# Patient Record
Sex: Male | Born: 1960 | Race: White | Hispanic: No | Marital: Married | Smoking: Never smoker
Health system: Southern US, Community
[De-identification: ages and names within clinical notes are randomized; demographics above are authoritative.]

## PROBLEM LIST (undated history)

## (undated) DIAGNOSIS — K219 Gastro-esophageal reflux disease without esophagitis: Secondary | ICD-10-CM

## (undated) DIAGNOSIS — I499 Cardiac arrhythmia, unspecified: Secondary | ICD-10-CM

## (undated) DIAGNOSIS — M1990 Unspecified osteoarthritis, unspecified site: Secondary | ICD-10-CM

## (undated) DIAGNOSIS — N200 Calculus of kidney: Secondary | ICD-10-CM

## (undated) HISTORY — PX: SHOULDER SURGERY: SHX246

## (undated) HISTORY — DX: Cardiac arrhythmia, unspecified: I49.9

## (undated) HISTORY — DX: Unspecified osteoarthritis, unspecified site: M19.90

## (undated) HISTORY — DX: Gastro-esophageal reflux disease without esophagitis: K21.9

## (undated) HISTORY — DX: Calculus of kidney: N20.0

## (undated) HISTORY — PX: KNEE SURGERY: SHX244

---

## 2010-02-13 DEATH — deceased

## 2012-07-02 DIAGNOSIS — M25569 Pain in unspecified knee: Secondary | ICD-10-CM

## 2012-07-02 HISTORY — DX: Pain in unspecified knee: M25.569

## 2014-01-13 DEATH — deceased

## 2014-09-09 ENCOUNTER — Ambulatory Visit
Admission: RE | Admit: 2014-09-09 | Discharge: 2014-09-09 | Disposition: A | Discharge status: Home / Self Care | Payer: No Typology Code available for payment source | Source: Ambulatory Visit | Attending: Nurse Practitioner | Admitting: Nurse Practitioner

## 2014-09-09 ENCOUNTER — Other Ambulatory Visit: Payer: Self-pay | Admitting: Nurse Practitioner

## 2014-09-09 DIAGNOSIS — M5441 Lumbago with sciatica, right side: Secondary | ICD-10-CM

## 2014-09-09 DIAGNOSIS — M5442 Lumbago with sciatica, left side: Principal | ICD-10-CM

## 2014-09-09 DIAGNOSIS — Z139 Encounter for screening, unspecified: Secondary | ICD-10-CM

## 2014-09-10 ENCOUNTER — Other Ambulatory Visit: Payer: Self-pay

## 2015-09-12 DIAGNOSIS — G8929 Other chronic pain: Secondary | ICD-10-CM | POA: Insufficient documentation

## 2015-09-12 DIAGNOSIS — M5412 Radiculopathy, cervical region: Secondary | ICD-10-CM | POA: Insufficient documentation

## 2015-09-12 HISTORY — DX: Radiculopathy, cervical region: M54.12

## 2015-09-12 HISTORY — DX: Other chronic pain: G89.29

## 2015-12-01 ENCOUNTER — Other Ambulatory Visit: Payer: Self-pay | Admitting: Orthopedic Surgery

## 2015-12-01 DIAGNOSIS — R609 Edema, unspecified: Secondary | ICD-10-CM

## 2015-12-01 DIAGNOSIS — M25561 Pain in right knee: Secondary | ICD-10-CM

## 2016-04-12 DIAGNOSIS — R079 Chest pain, unspecified: Secondary | ICD-10-CM

## 2016-04-12 DIAGNOSIS — R9439 Abnormal result of other cardiovascular function study: Secondary | ICD-10-CM | POA: Insufficient documentation

## 2016-04-12 DIAGNOSIS — I493 Ventricular premature depolarization: Secondary | ICD-10-CM

## 2016-04-12 HISTORY — DX: Chest pain, unspecified: R07.9

## 2016-04-12 HISTORY — DX: Abnormal result of other cardiovascular function study: R94.39

## 2016-04-12 HISTORY — DX: Ventricular premature depolarization: I49.3

## 2016-06-19 DIAGNOSIS — M5137 Other intervertebral disc degeneration, lumbosacral region: Secondary | ICD-10-CM | POA: Insufficient documentation

## 2016-06-19 DIAGNOSIS — M51379 Other intervertebral disc degeneration, lumbosacral region without mention of lumbar back pain or lower extremity pain: Secondary | ICD-10-CM

## 2016-06-19 HISTORY — DX: Other intervertebral disc degeneration, lumbosacral region without mention of lumbar back pain or lower extremity pain: M51.379

## 2017-01-24 IMAGING — CR DG ORBITS FOR FOREIGN BODY
2 series · 2 of 2 positions shown · non-contrast
Comparison: None.

CLINICAL DATA: Metal working/exposure; clearance prior to MRI

EXAM:
ORBITS FOR FOREIGN BODY - 2 VIEW

[w orbit pa (1 of 2)]
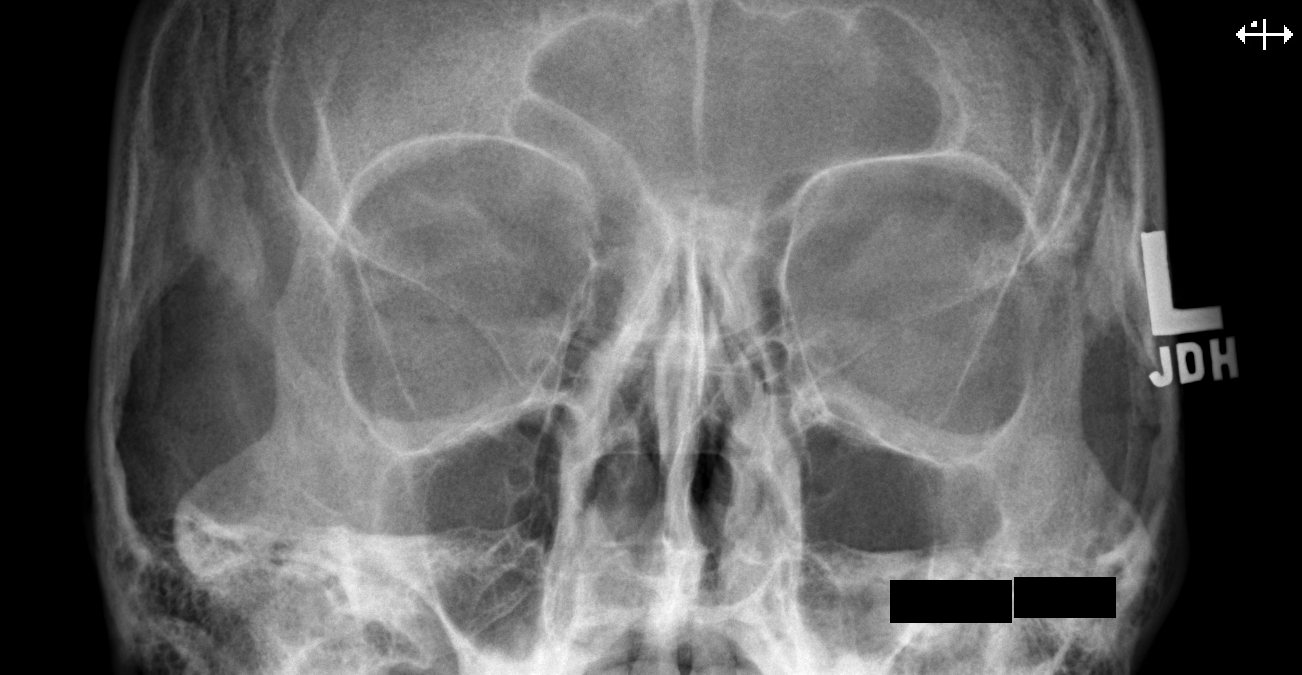

[w orbit pa (2 of 2)]
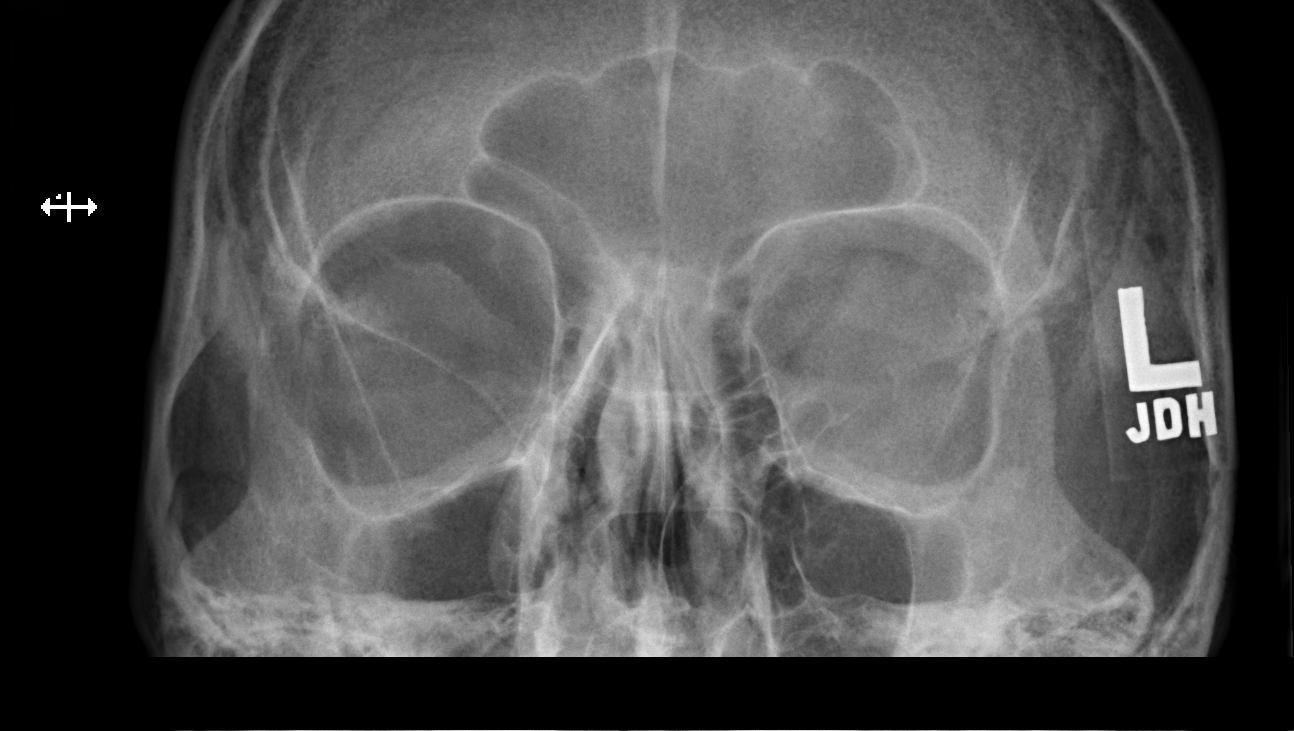

[2 of 2 positions shown; findings below may reference images not displayed]

FINDINGS: There is no evidence of metallic foreign body within the orbits. No
significant bone abnormality identified. The observed portions of
the paranasal sinuses are clear.
IMPRESSION: No evidence of metallic foreign body within the orbits.

## 2017-08-27 DIAGNOSIS — R1032 Left lower quadrant pain: Secondary | ICD-10-CM | POA: Insufficient documentation

## 2017-08-27 DIAGNOSIS — G629 Polyneuropathy, unspecified: Secondary | ICD-10-CM | POA: Insufficient documentation

## 2017-08-27 DIAGNOSIS — Z0000 Encounter for general adult medical examination without abnormal findings: Secondary | ICD-10-CM

## 2017-08-27 DIAGNOSIS — Z125 Encounter for screening for malignant neoplasm of prostate: Secondary | ICD-10-CM

## 2017-08-27 DIAGNOSIS — M25841 Other specified joint disorders, right hand: Secondary | ICD-10-CM

## 2017-08-27 HISTORY — DX: Polyneuropathy, unspecified: G62.9

## 2017-08-27 HISTORY — DX: Other specified joint disorders, right hand: M25.841

## 2017-08-27 HISTORY — DX: Left lower quadrant pain: R10.32

## 2017-08-27 HISTORY — DX: Encounter for general adult medical examination without abnormal findings: Z00.00

## 2017-08-27 HISTORY — DX: Encounter for screening for malignant neoplasm of prostate: Z12.5

## 2020-06-06 ENCOUNTER — Ambulatory Visit (INDEPENDENT_AMBULATORY_CARE_PROVIDER_SITE_OTHER): Payer: Self-pay | Admitting: Physician Assistant

## 2020-06-06 ENCOUNTER — Encounter: Payer: Self-pay | Admitting: Physician Assistant

## 2020-06-06 ENCOUNTER — Other Ambulatory Visit: Payer: Self-pay

## 2020-06-06 VITALS — BP 110/62 | HR 67 | Temp 97.3°F | Ht 67.0 in | Wt 185.6 lb

## 2020-06-06 DIAGNOSIS — G8929 Other chronic pain: Secondary | ICD-10-CM | POA: Insufficient documentation

## 2020-06-06 DIAGNOSIS — R252 Cramp and spasm: Secondary | ICD-10-CM | POA: Insufficient documentation

## 2020-06-06 DIAGNOSIS — M545 Low back pain, unspecified: Secondary | ICD-10-CM

## 2020-06-06 DIAGNOSIS — G629 Polyneuropathy, unspecified: Secondary | ICD-10-CM

## 2020-06-06 HISTORY — DX: Cramp and spasm: R25.2

## 2020-06-06 HISTORY — DX: Other chronic pain: G89.29

## 2020-06-06 MED ORDER — PREGABALIN 75 MG PO CAPS: 75.0000 mg | ORAL_CAPSULE | Freq: Two times a day (BID) | ORAL | 1 refills | Status: DC

## 2020-06-06 MED ORDER — MELOXICAM 7.5 MG PO TABS: 7.5000 mg | ORAL_TABLET | Freq: Every day | ORAL | 2 refills | Status: DC

## 2020-06-06 NOTE — Progress Notes (Signed)
New Patient Office Visit  Subjective:  Patient ID: Garrett Shea, male    DOB: 03/23/1961  Age: 60 y.o. MRN: [id number]  CC:  Chief Complaint  Patient presents with  . Back Pain    HPI Garrett Shea presents for chronic back pain - pt states that he has had problems with thoracic and low back pain for many years - has been told in past he has had some deteriorated discs, etc - states now he has tenderness along his spine - occasionally takes ibuprofen which helps some - denies weakness in extremities - no sciatica  Pt states that for awhile he has had cramps in both lower legs and feet - happens intermittently - at times causing significant pain  Pt states he was diagnosed with neuropathy more than 10 years ago - had workup including nerve conduction studies - was told possibly due to 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 Abnormal heart rhythm   . Arthritis   . GERD (gastroesophageal reflux disease)   . Kidney stones     Past Surgical History:  Procedure Laterality Date  . KNEE SURGERY Bilateral   . SHOULDER SURGERY Left     Family History  Problem Relation Age of Onset  . Cancer Father     Social History   Socioeconomic History  . Marital status: Married    Spouse name: Not on file  . Number of children: Not on file  . Years of education: Not on file  . Highest education level: Not on file  Occupational History  . Not on file  Tobacco Use  . Smoking status: Never Smoker  . Smokeless tobacco: Never Used  Vaping Use  . Vaping Use: Never used  Substance and Sexual Activity  . Alcohol use: Yes  . Drug use: Never  . Sexual activity: Not on file  Other Topics Concern  . Not on file  Social History Narrative  . Not on file   Social Determinants of Health   Financial Resource Strain: Not on file  Food  Insecurity: Not on file  Transportation Needs: Not on file  Physical Activity: Not on file  Stress: Not on file  Social Connections: Not on file  Intimate Partner Violence: Not on file     Current Outpatient Medications:  .  meloxicam (MOBIC) 7.5 MG tablet, Take 1 tablet (7.5 mg total) by mouth daily., Disp: 30 tablet, Rfl: 2 .  pregabalin (LYRICA) 75 MG capsule, Take 1 capsule (75 mg total) by mouth 2 (two) times daily., Disp: 60 capsule, Rfl: 1   No Known Allergies  ROS CONSTITUTIONAL: Negative for chills, fatigue, fever, unintentional weight gain and unintentional weight loss.  CARDIOVASCULAR: Negative for chest pain, dizziness, palpitations and pedal edema.  RESPIRATORY: Negative for recent cough and dyspnea.  GASTROINTESTINAL: Negative for abdominal pain, acid reflux symptoms, constipation, diarrhea, nausea and vomiting.  MSK: see HPI INTEGUMENTARY: Negative for rash.  PSYCHIATRIC: Negative for sleep disturbance and to question depression screen.  Negative for depression, negative for anhedonia.        Objective:    PHYSICAL EXAM:   VS: BP 110/62 (BP Location: Left Arm, Patient Position: Sitting, Cuff Size: Normal)   Pulse 67   Temp (!) 97.3 F (36.3 C) (Temporal)   Ht 5' 7"  (1.702 m)  Wt 185 lb 9.6 oz (84.2 kg)   SpO2 97%   BMI 29.07 kg/m   GEN: Well nourished, well developed, in no acute distress  Neck: no JVD or masses - no thyromegaly Cardiac: RRR; no murmurs, rubs, or gallops,no edema -  Respiratory:  normal respiratory rate and pattern with no distress - normal breath sounds with no rales, rhonchi, wheezes or rubs Neuro:  Alert and Oriented x 3, S - CN II-Xii grossly intact Psych: euthymic mood, appropriate affect and demeanor  BP 110/62 (BP Location: Left Arm, Patient Position: Sitting, Cuff Size: Normal)   Pulse 67   Temp (!) 97.3 F (36.3 C) (Temporal)   Ht 5' 7"  (1.702 m)   Wt 185 lb 9.6 oz (84.2 kg)   SpO2 97%   BMI 29.07 kg/m  Wt Readings  from Last 3 Encounters:  06/06/20 185 lb 9.6 oz (84.2 kg)     Health Maintenance Due  Topic Date Due  . COLONOSCOPY (Pts 45-38yr Insurance coverage will need to be confirmed)  Never done    There are no preventive care reminders to display for this patient.  No results found for: TSH No results found for: WBC, HGB, HCT, MCV, PLT No results found for: NA, K, CHLORIDE, CO2, GLUCOSE, BUN, CREATININE, BILITOT, ALKPHOS, AST, ALT, PROT, ALBUMIN, CALCIUM, ANIONGAP, EGFR, GFR No results found for: CHOL No results found for: HDL No results found for: LDLCALC No results found for: TRIG No results found for: CHOLHDL No results found for: HGBA1C    Assessment & Plan:   Problem List Items Addressed This Visit      Nervous and Auditory   Neuropathy - Primary   Relevant Medications   pregabalin (LYRICA) 75 MG capsule   Other Relevant Orders   CBC with Differential/Platelet   Comprehensive metabolic panel   TSH   Magnesium     Other   Leg cramps   Relevant Orders   CBC with Differential/Platelet   Comprehensive metabolic panel   TSH   Magnesium   Chronic bilateral low back pain without sciatica   Relevant Medications   meloxicam (MOBIC) 7.5 MG tablet   Other Relevant Orders   CBC with Differential/Platelet   Comprehensive metabolic panel   TSH   Magnesium      Meds ordered this encounter  Medications  . meloxicam (MOBIC) 7.5 MG tablet    Sig: Take 1 tablet (7.5 mg total) by mouth daily.    Dispense:  30 tablet    Refill:  2    Order Specific Question:   Supervising Provider    Answer:Rochel Brome[[id number] . pregabalin (LYRICA) 75 MG capsule    Sig: Take 1 capsule (75 mg total) by mouth 2 (two) times daily.    Dispense:  60 capsule    Refill:  1    Order Specific Question:   Supervising Provider    Answer:   CShelton Silvas   Follow-up: Return if symptoms worsen or fail to improve.    SARA R Konner Saiz, PA-C

## 2020-06-10 LAB — CBC WITH DIFFERENTIAL/PLATELET
Basophils Absolute: 0.1 10*3/uL (ref 0.0–0.2)
Basos: 1 %
EOS (ABSOLUTE): 0.2 10*3/uL (ref 0.0–0.4)
Eos: 2 %
Hematocrit: 47 % (ref 37.5–51.0)
Hemoglobin: 16.6 g/dL (ref 13.0–17.7)
Immature Grans (Abs): 0 10*3/uL (ref 0.0–0.1)
Immature Granulocytes: 0 %
Lymphocytes Absolute: 1.8 10*3/uL (ref 0.7–3.1)
Lymphs: 21 %
MCH: 32.3 pg (ref 26.6–33.0)
MCHC: 35.3 g/dL (ref 31.5–35.7)
MCV: 91 fL (ref 79–97)
Monocytes Absolute: 0.6 10*3/uL (ref 0.1–0.9)
Monocytes: 7 %
Neutrophils Absolute: 5.8 10*3/uL (ref 1.4–7.0)
Neutrophils: 69 %
Platelets: 229 10*3/uL (ref 150–450)
RBC: 5.14 x10E6/uL (ref 4.14–5.80)
RDW: 12.7 % (ref 11.6–15.4)
WBC: 8.4 10*3/uL (ref 3.4–10.8)

## 2020-06-10 LAB — COMPREHENSIVE METABOLIC PANEL
ALT: 16 IU/L (ref 0–44)
AST: 20 IU/L (ref 0–40)
Albumin/Globulin Ratio: 2 (ref 1.2–2.2)
Albumin: 4.7 g/dL (ref 3.8–4.9)
Alkaline Phosphatase: 106 IU/L (ref 44–121)
BUN/Creatinine Ratio: 15 (ref 9–20)
BUN: 12 mg/dL (ref 6–24)
Bilirubin Total: 1.1 mg/dL (ref 0.0–1.2)
CO2: 21 mmol/L (ref 20–29)
Calcium: 9.7 mg/dL (ref 8.7–10.2)
Chloride: 105 mmol/L (ref 96–106)
Creatinine, Ser: 0.81 mg/dL (ref 0.76–1.27)
GFR calc Af Amer: 112 mL/min/{1.73_m2} (ref >59)
GFR calc non Af Amer: 97 mL/min/{1.73_m2} (ref >59)
Globulin, Total: 2.3 g/dL (ref 1.5–4.5)
Glucose: 89 mg/dL (ref 65–99)
Potassium: 4.3 mmol/L (ref 3.5–5.2)
Sodium: 143 mmol/L (ref 134–144)
Total Protein: 7 g/dL (ref 6.0–8.5)

## 2020-06-10 LAB — TSH: TSH: 0.835 u[IU]/mL (ref 0.450–4.500)

## 2020-06-10 LAB — MAGNESIUM: Magnesium: 2.3 mg/dL (ref 1.6–2.3)

## 2020-06-13 ENCOUNTER — Telehealth: Payer: Self-pay

## 2020-06-13 ENCOUNTER — Encounter: Payer: Self-pay | Admitting: Physician Assistant

## 2020-06-13 NOTE — Telephone Encounter (Signed)
Patient states that he feels that his cough is getting better, He states that his wife sent the my chart message, she has been worried about him. He states he wants to watch his cough a few more days with him being self pay and it is mostly bad at night and don't think he needs an X-Ray right now and will call back if he think it is not getting better at night.   I asked patient is he coughing up anything drainage with his cough he states he has sometimes cough up some clear and some times a little green drainage, I told him with the green drainage it could be possible signs of infection, but he states he will keep an eye on it and call if it gets worse.

## 2020-06-15 ENCOUNTER — Other Ambulatory Visit: Payer: Self-pay | Admitting: Physician Assistant

## 2020-06-15 MED ORDER — PREGABALIN 150 MG PO CAPS: 150.0000 mg | ORAL_CAPSULE | Freq: Two times a day (BID) | ORAL | 0 refills | Status: DC

## 2020-06-16 ENCOUNTER — Telehealth: Payer: Self-pay

## 2020-06-16 ENCOUNTER — Other Ambulatory Visit: Payer: Self-pay

## 2020-06-16 MED ORDER — PREGABALIN 150 MG PO CAPS: 150.0000 mg | ORAL_CAPSULE | Freq: Two times a day (BID) | ORAL | 0 refills | Status: DC

## 2020-06-16 NOTE — Telephone Encounter (Signed)
Patient called wanted to get his Pregabalin sent to [facility] instead of [facility].   I have cancel the order for [facility].

## 2020-06-29 ENCOUNTER — Telehealth: Payer: Self-pay

## 2020-06-29 ENCOUNTER — Telehealth: Payer: Self-pay | Admitting: Physician Assistant

## 2020-06-29 NOTE — Telephone Encounter (Signed)
Addendum to other note on telephone call I recommend mobic 7.5mg  bid lyrica 150mg  bid He is saying his pain is 10/10 --- he has only been seen here once - I have  no recent studies (xrays,MRIs etc) to show why his pain is this severe Per his history he has had problems for years ... --- he needs to have a further workup from ortho or neuro - who would he like to see We do not treat chronic pain and he would need updated workup and could refer for further management

## 2020-06-29 NOTE — Telephone Encounter (Signed)
My recommendations are Mobic 7.5mg  twice daily Lyrica 150mg  twice daily If his pain is 10/10 we can refer to ortho or pain clinic --- we do not treat chronic pain

## 2020-06-29 NOTE — Telephone Encounter (Signed)
Pt left VM requesting call back.  Returned call to pt. Pt states nerves at night are bad. Cramping and pain at night is worse than during day. Moving around lessens pain so when relaxing it is hard. Lyrica does help during the day but at night he is requesting something to take the edge off the pain. 10/10 pain at night. He is doing better sleeping. Pt takes two 150 mg and one 100 mg of Lyrica in the evening and evening dose doesn't help. Takes one tablet of  meloxicam in the morning, takes two if bad but never more than two. Please advise.   Lorita Officer, CCMA 06/29/20 11:22 AM

## 2020-06-30 ENCOUNTER — Telehealth: Payer: Self-pay

## 2020-06-30 NOTE — Telephone Encounter (Signed)
Spoke with patient and he would like to be referred to ortho or neuro. He didn't state which one, He stated " He was under a house and needed to call me back."

## 2020-06-30 NOTE — Telephone Encounter (Signed)
Attempted to call pt. No answer, left generic VM for call back to clinic.   Lorita Officer, CCMA 06/30/20 9:50 AM

## 2020-07-17 ENCOUNTER — Other Ambulatory Visit: Payer: Self-pay | Admitting: Physician Assistant

## 2020-08-28 ENCOUNTER — Other Ambulatory Visit: Payer: Self-pay

## 2020-08-28 MED ORDER — PREGABALIN 150 MG PO CAPS: 150.0000 mg | ORAL_CAPSULE | Freq: Two times a day (BID) | ORAL | 3 refills | Status: DC

## 2020-09-08 ENCOUNTER — Ambulatory Visit: Payer: Self-pay | Admitting: Physician Assistant

## 2020-09-08 ENCOUNTER — Other Ambulatory Visit: Payer: Self-pay | Admitting: Physician Assistant

## 2020-09-08 ENCOUNTER — Telehealth: Payer: Self-pay

## 2020-09-08 DIAGNOSIS — M545 Low back pain, unspecified: Secondary | ICD-10-CM

## 2020-09-08 MED ORDER — MELOXICAM 7.5 MG PO TABS: ORAL_TABLET | ORAL | 2 refills | Status: DC

## 2020-09-08 NOTE — Telephone Encounter (Signed)
Will send in mobic 7.5mg  bid Is at max dose of lyrica

## 2020-09-08 NOTE — Telephone Encounter (Signed)
Patient made aware of information, verbalized understanding. 

## 2020-09-08 NOTE — Telephone Encounter (Signed)
Patient stated that he hurt his knee and it was inflamed and wanted to know if you could send him in something for it.  I looked at patient left knee, it is red and little swollen. Patient can walk and bend the knee.  Also wanted to go up on lyrica due to his neuropathy is not getting better.

## 2020-09-18 ENCOUNTER — Telehealth: Payer: Self-pay

## 2020-09-18 NOTE — Telephone Encounter (Signed)
Clydie Braun called this morning wanting to get Garrett Shea an appointment for anger/depression.  I asked if her if he was having any suicidal thoughts: thoughts of hurting his self or someone. Clydie Braun stated yes. I spoke with Dr. Sedalia Muta, she stated that he needs to go to [facility] at [address] phone [phone] or go to the nearest ED. Clydie Braun stated that he probably want go, he is self pay, and she doesn't think he is going to kill himself. I spoke with Dr. Sedalia Muta again about the situation, Dr. Sedalia Muta reiterated that he needs to be seen at [facility] or the ED. Clydie Braun was notified.

## 2020-11-08 ENCOUNTER — Ambulatory Visit: Payer: Self-pay | Admitting: Physician Assistant

## 2021-05-14 ENCOUNTER — Other Ambulatory Visit: Payer: Self-pay | Admitting: Legal Medicine

## 2021-05-18 ENCOUNTER — Other Ambulatory Visit: Payer: Self-pay | Admitting: Legal Medicine

## 2021-05-18 DIAGNOSIS — G629 Polyneuropathy, unspecified: Secondary | ICD-10-CM

## 2021-05-18 MED ORDER — PREGABALIN 150 MG PO CAPS: 150.0000 mg | ORAL_CAPSULE | Freq: Two times a day (BID) | ORAL | 3 refills | Status: DC

## 2021-05-22 ENCOUNTER — Ambulatory Visit: Payer: Self-pay | Admitting: Physician Assistant

## 2021-08-16 ENCOUNTER — Ambulatory Visit: Payer: Self-pay | Admitting: Physician Assistant

## 2021-08-16 ENCOUNTER — Encounter: Payer: Self-pay | Admitting: Physician Assistant

## 2021-08-16 VITALS — BP 136/80 | HR 72 | Temp 98.2°F | Ht 68.0 in | Wt 166.0 lb

## 2021-08-16 DIAGNOSIS — F418 Other specified anxiety disorders: Secondary | ICD-10-CM

## 2021-08-16 DIAGNOSIS — G629 Polyneuropathy, unspecified: Secondary | ICD-10-CM

## 2021-08-16 MED ORDER — CITALOPRAM HYDROBROMIDE 10 MG PO TABS: 10.0000 mg | ORAL_TABLET | Freq: Every day | ORAL | 1 refills | Status: DC

## 2021-08-16 NOTE — Progress Notes (Signed)
? ?Subjective:  ?Patient ID: Garrett Shea, male    DOB: 09/02/1960  Age: 61 y.o. MRN: [id number] ? ?Chief Complaint  ?Patient presents with  ? Peripheral Neuropathy  ? ? ?HPI ? Pt in today for follow up of peripheral neuropathy - pt continues to take lyrica 150mg  bid- he states he has had extensive workup in the past and has been on this medication for several years - he has not been in our office for over a year but has been seeing ortho for knee and shoulder pain ? ?Pt also in today with complaints of depression and 'heartbreak' - says that he and his wife had been having marital issues and they had been [facility] however wife left him 2 weeks ago.  He states he has pictures that show the probability of his wife having extra marital affair.  Pt has been depressed and having trouble eating, sleeping, crying spells.  Has never been on medication in the past for these type symptoms - denies suicidal or homicidal thoughts ?Current Outpatient Medications on File Prior to Visit  ?Medication Sig Dispense Refill  ? aspirin 81 MG EC tablet Take by mouth.    ? pregabalin (LYRICA) 150 MG capsule Take 1 capsule (150 mg total) by mouth 2 (two) times daily. 60 capsule 3  ? ?No current facility-administered medications on file prior to visit.  ? ?Past Medical History:  ?Diagnosis Date  ? Abnormal heart rhythm   ? Arthritis   ? GERD (gastroesophageal reflux disease)   ? Kidney stones   ? ?Past Surgical History:  ?Procedure Laterality Date  ? KNEE SURGERY Bilateral   ? SHOULDER SURGERY Left   ?  ?Family History  ?Problem Relation Age of Onset  ? Cancer Father   ? ?Social History  ? ?Socioeconomic History  ? Marital status: Married  ?  Spouse name: Not on file  ? Number of children: Not on file  ? Years of education: Not on file  ? Highest education level: Not on file  ?Occupational History  ? Not on file  ?Tobacco Use  ? Smoking status: Never  ? Smokeless tobacco: Never  ?Vaping Use  ? Vaping Use: Never  used  ?Substance and Sexual Activity  ? Alcohol use: Yes  ? Drug use: Never  ? Sexual activity: Not on file  ?Other Topics Concern  ? Not on file  ?Social History Narrative  ? Not on file  ? ?Social Determinants of Health  ? ?Financial Resource Strain: Not on file  ?Food Insecurity: Not on file  ?Transportation Needs: Not on file  ?Physical Activity: Not on file  ?Stress: Not on file  ?Social Connections: Not on file  ? ? ?Review of Systems ?CONSTITUTIONAL: Negative for chills, fatigue, fever, ?CARDIOVASCULAR: Negative for chest pain, dizziness, palpitations  ?Resp - no dyspnea ?GASTROINTESTINAL: Negative for abdominal pain, acid reflux symptoms, constipation, diarrhea, nausea and vomiting.  ? ?PSYCHIATRIC: see HPI ?   ? ? ?Objective:  ?BP 136/80   Pulse 72   Temp 98.2 ?F (36.8 ?C)   Ht 5\' 8"  (1.727 m)   Wt 166 lb (75.3 kg)   SpO2 96%   BMI 25.24 kg/m?  ? ? ?  08/16/2021  ?  1:34 PM 06/06/2020  ? 11:06 AM  ?BP/Weight  ?Systolic BP 136 110  ?Diastolic BP 80 62  ?Wt. (Lbs) 166 185.6  ?BMI 25.24 kg/m2 29.07 kg/m2  ? ? ?Physical Exam ?PHYSICAL EXAM:  ? ?VS:  BP 136/80   Pulse 72   Temp 98.2 ?F (36.8 ?C)   Ht 5\' 8"  (1.727 m)   Wt 166 lb (75.3 kg)   SpO2 96%   BMI 25.24 kg/m?  ? ?GEN: Well nourished, well developed, in no acute distress  ?Cardiac: RRR; no murmurs, rubs, or gallops,no edema - ?Respiratory:  normal respiratory rate and pattern with no distress - normal breath sounds with no rales, rhonchi, wheezes or rubs ? ?Psych:pt tearful ? ?Diabetic Foot Exam - Simple   ?No data filed ?  ?  ? ?Lab Results  ?Component Value Date  ? WBC 8.4 06/09/2020  ? HGB 16.6 06/09/2020  ? HCT 47.0 06/09/2020  ? PLT 229 06/09/2020  ? GLUCOSE 89 06/09/2020  ? ALT 16 06/09/2020  ? AST 20 06/09/2020  ? NA 143 06/09/2020  ? K 4.3 06/09/2020  ? CL 105 06/09/2020  ? CREATININE 0.81 06/09/2020  ? BUN 12 06/09/2020  ? CO2 21 06/09/2020  ? TSH 0.835 06/09/2020  ? ? ? ? ?Assessment & Plan:  ? ?Problem List Items Addressed This Visit    ? ?  ? Nervous and Auditory  ? Neuropathy - Primary ?Continue current meds  ? ?Other Visit Diagnoses   ? ? Depression with anxiety      ? Relevant Medications  ? citalopram (CELEXA) 10 MG tablet ?Continue therapy  ? ?  ?. ? ?Meds ordered this encounter  ?Medications  ? citalopram (CELEXA) 10 MG tablet  ?  Sig: Take 1 tablet (10 mg total) by mouth daily.  ?  Dispense:  30 tablet  ?  Refill:  1  ?  Order Specific Question:   Supervising Provider  ?  Answer02/27/2022 Blane Ohara  ? ? ?No orders of the defined types were placed in this encounter. ?  ? ?Follow-up: Return in about 4 weeks (around 09/13/2021) for follow up. ? ?An After Visit Summary was printed and given to the patient. ? ?SARA R Moishe Schellenberg, PA-C ?[facility] ?[phone] ?

## 2021-09-12 ENCOUNTER — Ambulatory Visit: Payer: Self-pay | Admitting: Physician Assistant

## 2021-09-12 ENCOUNTER — Encounter: Payer: Self-pay | Admitting: Physician Assistant

## 2021-09-12 VITALS — BP 116/68 | HR 54 | Temp 97.3°F | Ht 68.0 in | Wt 170.0 lb

## 2021-09-12 DIAGNOSIS — F418 Other specified anxiety disorders: Secondary | ICD-10-CM

## 2021-09-12 MED ORDER — TRAZODONE HCL 50 MG PO TABS: 50.0000 mg | ORAL_TABLET | Freq: Every day | ORAL | 1 refills | Status: DC

## 2021-09-12 NOTE — Progress Notes (Signed)
Subjective:  Patient ID: Garrett Shea, male    DOB: 11-22-1960  Age: 61 y.o. MRN: [id number]  Chief Complaint  Patient presents with   Depression    HPI  Pt in for follow up of mild depression with anxiety symptoms - he states he took about 10 pills of citalopram and states he did not feel it was helping and was starting to have crazy dreams and did not like the way he was feeling.  He is still feeling depressed and anxious - had actually taken a friends valium and states that helped but told we do not use that for long term anxiety and that will not address his depressive symptoms He does have an appt with counselor on 6/19 Current Outpatient Medications on File Prior to Visit  Medication Sig Dispense Refill   aspirin 81 MG EC tablet Take by mouth.     pregabalin (LYRICA) 150 MG capsule Take 1 capsule (150 mg total) by mouth 2 (two) times daily. 60 capsule 3   No current facility-administered medications on file prior to visit.   Past Medical History:  Diagnosis Date   Abnormal heart rhythm    Arthritis    GERD (gastroesophageal reflux disease)    Kidney stones    Past Surgical History:  Procedure Laterality Date   KNEE SURGERY Bilateral    SHOULDER SURGERY Left     Family History  Problem Relation Age of Onset   Cancer Father    Social History   Socioeconomic History   Marital status: Married    Spouse name: Not on file   Number of children: Not on file   Years of education: Not on file   Highest education level: Not on file  Occupational History   Not on file  Tobacco Use   Smoking status: Never   Smokeless tobacco: Never  Vaping Use   Vaping Use: Never used  Substance and Sexual Activity   Alcohol use: Yes   Drug use: Never   Sexual activity: Not on file  Other Topics Concern   Not on file  Social History Narrative   Not on file   Social Determinants of Health   Financial Resource Strain: Not on file  Food Insecurity: Not on file  Transportation  Needs: Not on file  Physical Activity: Not on file  Stress: Not on file  Social Connections: Not on file    Review of Systems CONSTITUTIONAL: Negative for chills, fatigue, fever,   CARDIOVASCULAR: Negative for chest pain, dizziness, palpitations and pedal edema.  RESPIRATORY: Negative for recent cough and dyspnea.   PSYCHIATRIC: see HPI   Objective:  PHYSICAL EXAM:   VS: BP 116/68 (BP Location: Left Arm, Patient Position: Sitting)   Pulse (!) 54   Temp (!) 97.3 F (36.3 C) (Temporal)   Ht 5\' 8"  (1.727 m)   Wt 170 lb (77.1 kg)   SpO2 98%   BMI 25.85 kg/m   GEN: Well nourished, well developed, in no acute distress   Cardiac: RRR; no murmurs, rubs, Respiratory:  normal respiratory rate and pattern with no distress - normal breath sounds with no rales, rhonchi, wheezes or rubs Psych: euthymic mood, appropriate affect and demeanor  Lab Results  Component Value Date   WBC 8.4 06/09/2020   HGB 16.6 06/09/2020   HCT 47.0 06/09/2020   PLT 229 06/09/2020   GLUCOSE 89 06/09/2020   ALT 16 06/09/2020   AST 20 06/09/2020   NA 143 06/09/2020   K 4.3  06/09/2020   CL 105 06/09/2020   CREATININE 0.81 06/09/2020   BUN 12 06/09/2020   CO2 21 06/09/2020   TSH 0.835 06/09/2020      Assessment & Plan:   Problem List Items Addressed This Visit   None Visit Diagnoses     Depression with anxiety    -  Primary   Relevant Medications   traZODone (DESYREL) 50 MG tablet     .  Meds ordered this encounter  Medications   traZODone (DESYREL) 50 MG tablet    Sig: Take 1 tablet (50 mg total) by mouth at bedtime.    Dispense:  30 tablet    Refill:  1    Order Specific Question:   Supervising Provider    Answer:   Corey Harold    No orders of the defined types were placed in this encounter.    Follow-up: Return in about 2 months (around 11/12/2021) for follow up.  An After Visit Summary was printed and given to the patient.  Jettie Pagan [facility] [phone]

## 2021-11-12 ENCOUNTER — Ambulatory Visit: Payer: Self-pay | Admitting: Physician Assistant

## 2021-11-28 DIAGNOSIS — F4325 Adjustment disorder with mixed disturbance of emotions and conduct: Secondary | ICD-10-CM | POA: Diagnosis not present

## 2021-12-18 DIAGNOSIS — F4325 Adjustment disorder with mixed disturbance of emotions and conduct: Secondary | ICD-10-CM | POA: Diagnosis not present

## 2022-01-16 DIAGNOSIS — F4325 Adjustment disorder with mixed disturbance of emotions and conduct: Secondary | ICD-10-CM | POA: Diagnosis not present

## 2022-01-31 DIAGNOSIS — F4325 Adjustment disorder with mixed disturbance of emotions and conduct: Secondary | ICD-10-CM | POA: Diagnosis not present

## 2022-02-14 ENCOUNTER — Other Ambulatory Visit: Payer: Self-pay | Admitting: Legal Medicine

## 2022-02-14 DIAGNOSIS — G629 Polyneuropathy, unspecified: Secondary | ICD-10-CM

## 2022-02-15 ENCOUNTER — Other Ambulatory Visit: Payer: Self-pay

## 2022-02-15 DIAGNOSIS — G629 Polyneuropathy, unspecified: Secondary | ICD-10-CM

## 2022-02-15 MED ORDER — PREGABALIN 150 MG PO CAPS: 150.0000 mg | ORAL_CAPSULE | Freq: Two times a day (BID) | ORAL | 3 refills | Status: DC

## 2022-02-15 NOTE — Telephone Encounter (Signed)
Appointment made

## 2022-02-21 ENCOUNTER — Telehealth: Payer: Self-pay

## 2022-02-21 DIAGNOSIS — R051 Acute cough: Secondary | ICD-10-CM | POA: Diagnosis not present

## 2022-02-21 DIAGNOSIS — J040 Acute laryngitis: Secondary | ICD-10-CM | POA: Diagnosis not present

## 2022-02-21 NOTE — Telephone Encounter (Signed)
Patient called and stated he has an heart rate of 44 and has some sinus symptoms.  Per Marianne Sofia: Because of the low heart rate and his symptoms patient needs to go to nearest hospital  Patient verbalized Understanding.

## 2022-03-04 DIAGNOSIS — R197 Diarrhea, unspecified: Secondary | ICD-10-CM | POA: Diagnosis not present

## 2022-03-04 DIAGNOSIS — R112 Nausea with vomiting, unspecified: Secondary | ICD-10-CM | POA: Diagnosis not present

## 2022-03-27 ENCOUNTER — Telehealth: Payer: Self-pay

## 2022-03-27 NOTE — Telephone Encounter (Signed)
Patient called and stated he wanted to cancel appointment due to running a fever and he will call and reschedule appointment when he feels better.

## 2022-03-29 ENCOUNTER — Ambulatory Visit: Payer: Self-pay | Admitting: Physician Assistant

## 2022-05-13 DIAGNOSIS — R9431 Abnormal electrocardiogram [ECG] [EKG]: Secondary | ICD-10-CM | POA: Diagnosis not present

## 2022-05-13 DIAGNOSIS — F1290 Cannabis use, unspecified, uncomplicated: Secondary | ICD-10-CM | POA: Diagnosis not present

## 2022-05-13 DIAGNOSIS — F1210 Cannabis abuse, uncomplicated: Secondary | ICD-10-CM | POA: Diagnosis not present

## 2022-05-13 DIAGNOSIS — Z1331 Encounter for screening for depression: Secondary | ICD-10-CM | POA: Diagnosis not present

## 2022-05-13 DIAGNOSIS — Z046 Encounter for general psychiatric examination, requested by authority: Secondary | ICD-10-CM | POA: Diagnosis not present

## 2022-05-14 DIAGNOSIS — F1290 Cannabis use, unspecified, uncomplicated: Secondary | ICD-10-CM | POA: Diagnosis not present

## 2022-05-14 DIAGNOSIS — Z1331 Encounter for screening for depression: Secondary | ICD-10-CM | POA: Diagnosis not present

## 2022-05-15 ENCOUNTER — Telehealth: Payer: Self-pay

## 2022-05-15 NOTE — Telephone Encounter (Signed)
Transition Care Management Unsuccessful Follow-up Telephone Call  Date of discharge and from where:  [facility] 05/14/2022  Attempts:  1st Attempt  Reason for unsuccessful TCM follow-up call:  Left voice message Juanda Crumble, [place] Direct Dial [phone]

## 2022-05-15 NOTE — Telephone Encounter (Signed)
Transition Care Management Follow-up Telephone Call Date of discharge and from where: [facility] 05/14/2022 How have you been since you were released from the hospital? Not good Any questions or concerns? No  Items Reviewed: Did the pt receive and understand the discharge instructions provided? Yes  Medications obtained and verified? Yes  Other? No  Any new allergies since your discharge? No  Dietary orders reviewed? Yes Do you have support at home? No   Home Care and Equipment/Supplies: Were home health services ordered? no If so, what is the name of the agency? N/a  Has the agency set up a time to come to the patient's home? no Were any new equipment or medical supplies ordered?  No What is the name of the medical supply agency? N/a Were you able to get the supplies/equipment? no Do you have any questions related to the use of the equipment or supplies? No  Functional Questionnaire: (I = Independent and D = Dependent) ADLs: I  Bathing/Dressing- I  Meal Prep- I  Eating- I  Maintaining continence- I  Transferring/Ambulation- I  Managing Meds- I  Follow up appointments reviewed:  PCP Hospital f/u appt confirmed? No  no avail appts until 05/27/2022 sent message to staff to schedule Specialist Hospital f/u appt confirmed? No   Are transportation arrangements needed? No  If their condition worsens, is the pt aware to call PCP or go to the Emergency Dept.? Yes Was the patient provided with contact information for the PCP's office or ED? Yes Was to pt encouraged to call back with questions or concerns? Yes Juanda Crumble, LPN [place] Direct Dial [phone]

## 2022-05-23 DIAGNOSIS — F4323 Adjustment disorder with mixed anxiety and depressed mood: Secondary | ICD-10-CM | POA: Diagnosis not present

## 2022-05-29 DIAGNOSIS — F331 Major depressive disorder, recurrent, moderate: Secondary | ICD-10-CM | POA: Diagnosis not present

## 2022-05-30 DIAGNOSIS — F331 Major depressive disorder, recurrent, moderate: Secondary | ICD-10-CM | POA: Diagnosis not present

## 2022-07-08 DIAGNOSIS — F331 Major depressive disorder, recurrent, moderate: Secondary | ICD-10-CM | POA: Diagnosis not present

## 2022-07-11 DIAGNOSIS — F419 Anxiety disorder, unspecified: Secondary | ICD-10-CM | POA: Diagnosis not present

## 2022-07-18 DIAGNOSIS — F419 Anxiety disorder, unspecified: Secondary | ICD-10-CM | POA: Diagnosis not present

## 2022-08-02 DIAGNOSIS — H9201 Otalgia, right ear: Secondary | ICD-10-CM | POA: Diagnosis not present

## 2022-08-02 DIAGNOSIS — J309 Allergic rhinitis, unspecified: Secondary | ICD-10-CM | POA: Diagnosis not present

## 2022-08-02 DIAGNOSIS — H6691 Otitis media, unspecified, right ear: Secondary | ICD-10-CM | POA: Diagnosis not present

## 2022-08-08 DIAGNOSIS — F419 Anxiety disorder, unspecified: Secondary | ICD-10-CM | POA: Diagnosis not present

## 2022-08-22 ENCOUNTER — Ambulatory Visit: Payer: Self-pay | Admitting: Physician Assistant

## 2022-08-22 DIAGNOSIS — R0981 Nasal congestion: Secondary | ICD-10-CM | POA: Diagnosis not present

## 2022-08-22 DIAGNOSIS — H9201 Otalgia, right ear: Secondary | ICD-10-CM | POA: Diagnosis not present

## 2022-08-22 DIAGNOSIS — J0100 Acute maxillary sinusitis, unspecified: Secondary | ICD-10-CM | POA: Diagnosis not present

## 2022-08-27 ENCOUNTER — Emergency Department (HOSPITAL_COMMUNITY)
Admission: EM | Admit: 2022-08-27 | Discharge: 2022-08-28 | Disposition: A | Discharge status: Home / Self Care | Payer: Worker's Compensation | Attending: Student | Admitting: Student

## 2022-08-27 ENCOUNTER — Encounter (HOSPITAL_COMMUNITY): Payer: Self-pay

## 2022-08-27 ENCOUNTER — Other Ambulatory Visit: Payer: Self-pay

## 2022-08-27 DIAGNOSIS — S46211A Strain of muscle, fascia and tendon of other parts of biceps, right arm, initial encounter: Secondary | ICD-10-CM

## 2022-08-27 DIAGNOSIS — S4991XA Unspecified injury of right shoulder and upper arm, initial encounter: Secondary | ICD-10-CM | POA: Diagnosis not present

## 2022-08-27 DIAGNOSIS — X500XXA Overexertion from strenuous movement or load, initial encounter: Secondary | ICD-10-CM | POA: Diagnosis not present

## 2022-08-27 DIAGNOSIS — Z7982 Long term (current) use of aspirin: Secondary | ICD-10-CM | POA: Diagnosis not present

## 2022-08-27 LAB — CBC WITH DIFFERENTIAL/PLATELET
Abs Immature Granulocytes: 0.03 10*3/uL (ref 0.00–0.07)
Basophils Absolute: 0.1 10*3/uL (ref 0.0–0.1)
Basophils Relative: 1 %
Eosinophils Absolute: 0.1 10*3/uL (ref 0.0–0.5)
Eosinophils Relative: 2 %
HCT: 45.8 % (ref 39.0–52.0)
Hemoglobin: 15.7 g/dL (ref 13.0–17.0)
Immature Granulocytes: 0 %
Lymphocytes Relative: 21 %
Lymphs Abs: 1.5 10*3/uL (ref 0.7–4.0)
MCH: 32 pg (ref 26.0–34.0)
MCHC: 34.3 g/dL (ref 30.0–36.0)
MCV: 93.3 fL (ref 80.0–100.0)
Monocytes Absolute: 0.4 10*3/uL (ref 0.1–1.0)
Monocytes Relative: 6 %
Neutro Abs: 5 10*3/uL (ref 1.7–7.7)
Neutrophils Relative %: 70 %
Platelets: 240 10*3/uL (ref 150–400)
RBC: 4.91 MIL/uL (ref 4.22–5.81)
RDW: 12.3 % (ref 11.5–15.5)
WBC: 7.2 10*3/uL (ref 4.0–10.5)
nRBC: 0 % (ref 0.0–0.2)

## 2022-08-27 LAB — BASIC METABOLIC PANEL
Anion gap: 10 (ref 5–15)
BUN: 11 mg/dL (ref 8–23)
CO2: 24 mmol/L (ref 22–32)
Calcium: 9.4 mg/dL (ref 8.9–10.3)
Chloride: 105 mmol/L (ref 98–111)
Creatinine, Ser: 0.99 mg/dL (ref 0.61–1.24)
GFR, Estimated: >60 mL/min (ref >60)
Glucose, Bld: 164 mg/dL — ABNORMAL HIGH (ref 70–99)
Potassium: 4 mmol/L (ref 3.5–5.1)
Sodium: 139 mmol/L (ref 135–145)

## 2022-08-27 NOTE — ED Triage Notes (Signed)
Pt arrives with c/o right arm injury that happened today. Pt seen at [facility] and told her had a tendon rupture and needed to be evaluated at the ED.

## 2022-08-28 ENCOUNTER — Emergency Department (HOSPITAL_COMMUNITY): Payer: Self-pay

## 2022-08-28 MED ORDER — KETOROLAC TROMETHAMINE 15 MG/ML IJ SOLN
15.0000 mg | Freq: Once | INTRAMUSCULAR | Status: AC
  Administered 2022-08-28: 15 mg via INTRAMUSCULAR
  Filled 2022-08-28: qty 1

## 2022-08-28 MED ORDER — NAPROXEN 375 MG PO TABS: 375.0000 mg | ORAL_TABLET | Freq: Two times a day (BID) | ORAL | 0 refills | Status: DC

## 2022-08-28 MED ORDER — OXYCODONE HCL 5 MG PO TABS: 5.0000 mg | ORAL_TABLET | ORAL | 0 refills | Status: AC | PRN

## 2022-08-28 MED ORDER — ACETAMINOPHEN 500 MG PO TABS: 1000.0000 mg | ORAL_TABLET | Freq: Three times a day (TID) | ORAL | 0 refills | Status: AC

## 2022-08-28 NOTE — ED Provider Notes (Signed)
[place] EMERGENCY DEPARTMENT AT [facility] Provider Note  CSN: [id number] Arrival date & time: 08/27/22 1930  Chief Complaint(s) Arm Injury  HPI Garrett Shea is a 62 y.o. male who presents emergency department for evaluation of right upper extremity injury.  Patient was reportedly moving heavy tractor around 8 AM on 08/27/2022 when he had a sudden onset sharp pain in the arm.  He went to urgent care who had concern for tendon rupture and sent him to the emergency department.  Here in the emergency room, patient arrives with a palpable tender mass over the right bicep but denies numbness, tingling, weakness of the upper extremity.  Pulses intact on arrival.  No additional external signs of trauma seen.   Past Medical History Past Medical History:  Diagnosis Date   Abnormal heart rhythm    Arthritis    GERD (gastroesophageal reflux disease)    Kidney stones    Patient Active Problem List   Diagnosis Date Noted   Leg cramps 06/06/2020   Chronic bilateral low back pain without sciatica 06/06/2020   Cyst of joint of hand, right 08/27/2017   Left lower quadrant pain 08/27/2017   Neuropathy 08/27/2017   Screening PSA (prostate specific antigen) 08/27/2017   Well adult on routine health check 08/27/2017   Degeneration of intervertebral disc at L5-S1 level 06/19/2016   Abnormal stress echocardiogram 04/12/2016   Chest pain 04/12/2016   Premature ventricular contractions 04/12/2016   Cervical radiculopathy 09/12/2015   Chronic right shoulder pain 09/12/2015   Knee pain 07/02/2012   Home Medication(s) Prior to Admission medications   Medication Sig Start Date End Date Taking? Authorizing Provider  aspirin 81 MG EC tablet Take by mouth.    [provider]  pregabalin (LYRICA) 150 MG capsule Take 1 capsule (150 mg total) by mouth 2 (two) times daily. 02/15/22   CoxFritzi Mandes, MD  traZODone (DESYREL) 50 MG tablet Take 1 tablet (50 mg total) by mouth at bedtime. 09/12/21    Marianne Sofia, PA-C                                                                                                                                    Past Surgical History Past Surgical History:  Procedure Laterality Date   KNEE SURGERY Bilateral    SHOULDER SURGERY Left    Family History Family History  Problem Relation Age of Onset   Cancer Father     Social History Social History   Tobacco Use   Smoking status: Never   Smokeless tobacco: Never  Vaping Use   Vaping Use: Never used  Substance Use Topics   Alcohol use: Yes   Drug use: Never   Allergies Patient has no known allergies.  Review of Systems Review of Systems  Musculoskeletal:  Positive for arthralgias, joint swelling and myalgias.    Physical Exam Vital Signs  I have reviewed the triage vital signs  BP 126/66 (BP Location: Left Arm)   Pulse (!) 50   Temp 97.9 F (36.6 C) (Oral)   Resp 16   Wt 77.1 kg   SpO2 98%   BMI 25.85 kg/m   Physical Exam Constitutional:      General: He is not in acute distress.    Appearance: Normal appearance.  HENT:     Head: Normocephalic and atraumatic.     Nose: No congestion or rhinorrhea.  Eyes:     General:        Right eye: No discharge.        Left eye: No discharge.     Extraocular Movements: Extraocular movements intact.     Pupils: Pupils are equal, round, and reactive to light.  Cardiovascular:     Rate and Rhythm: Normal rate and regular rhythm.     Heart sounds: No murmur heard. Pulmonary:     Effort: No respiratory distress.     Breath sounds: No wheezing or rales.  Abdominal:     General: There is no distension.     Tenderness: There is no abdominal tenderness.  Musculoskeletal:        General: Swelling, tenderness and deformity present. Normal range of motion.     Cervical back: Normal range of motion.  Skin:    General: Skin is warm and dry.  Neurological:     General: No focal deficit present.     Mental Status: He is alert.     ED  Results and Treatments Labs (all labs ordered are listed, but only abnormal results are displayed) Labs Reviewed  BASIC METABOLIC PANEL - Abnormal; Notable for the following components:      Result Value   Glucose, Bld 164 (*)    All other components within normal limits  CBC WITH DIFFERENTIAL/PLATELET                                                                                                                          Radiology No results found.  Pertinent labs & imaging results that were available during my care of the patient were reviewed by me and considered in my medical decision making (see MDM for details).  Medications Ordered in ED Medications - No data to display  Procedures Procedures  (including critical care time)  Medical Decision Making / ED Course   This patient presents to the ED for concern of right arm injury, this involves an extensive number of treatment options, and is a complaint that carries with it a high risk of complications and morbidity.  The differential diagnosis includes biceps tendon rupture, fracture, ligamentous injury, hematoma, contusion  MDM: Patient seen emergency room for evaluation of a right arm injury.  Physical exam with a painful tender mass at the insertion point of the bicep at the antecubital fossa concerning for a biceps tear.  X-ray without fracture.  I spoke with Dr. Ave Filter of orthopedics who is recommending the patient follow-up with him in clinic this morning at 8 AM.  Patient will call the office for urgent ER follow-up.  Patient placed in a sling and pain controlled with Toradol.  Patient then discharged with outpatient orthopedic follow-up.  Additional history obtained: -External records from outside source obtained and reviewed including: Chart review including previous notes, labs, imaging,  consultation notes   Lab Tests: -I ordered, reviewed, and interpreted labs.   The pertinent results include:   Labs Reviewed  BASIC METABOLIC PANEL - Abnormal; Notable for the following components:      Result Value   Glucose, Bld 164 (*)    All other components within normal limits  CBC WITH DIFFERENTIAL/PLATELET      Imaging Studies ordered: I ordered imaging studies including XR humerus I independently visualized and interpreted imaging. I agree with the radiologist interpretation   Medicines ordered and prescription drug management: No orders of the defined types were placed in this encounter.   -I have reviewed the patients home medicines and have made adjustments as needed  Critical interventions none  Consultations Obtained: I requested consultation with the orthopedic surgeon on-call Dr. Ave Filter,  and discussed lab and imaging findings as well as pertinent plan - they recommend: Outpatient follow-up this morning   Cardiac Monitoring: The patient was maintained on a cardiac monitor.  I personally viewed and interpreted the cardiac monitored which showed an underlying rhythm of: NSR  Social Determinants of Health:  Factors impacting patients care include: none   Reevaluation: After the interventions noted above, I reevaluated the patient and found that they have :improved  Co morbidities that complicate the patient evaluation  Past Medical History:  Diagnosis Date   Abnormal heart rhythm    Arthritis    GERD (gastroesophageal reflux disease)    Kidney stones       Dispostion: I considered admission for this patient, but at this time he does not meet inpatient criteria for admission he is safe for discharge with urgent orthopedic follow-up     Final Clinical Impression(s) / ED Diagnoses Final diagnoses:  None     @PCDICTATION @    Glendora Score, MD 08/28/22 0124

## 2022-08-28 NOTE — Discharge Instructions (Addendum)
For pain:  - Acetaminophen 1000 mg three times daily (every 8 hours) - Naproxen 2 times daily (every 12 hours) - oxycodone for breakthrough pain only 

## 2022-09-04 DIAGNOSIS — F419 Anxiety disorder, unspecified: Secondary | ICD-10-CM | POA: Diagnosis not present

## 2022-09-14 DIAGNOSIS — S0001XA Abrasion of scalp, initial encounter: Secondary | ICD-10-CM | POA: Diagnosis not present

## 2022-09-23 ENCOUNTER — Encounter: Payer: Self-pay | Admitting: Physician Assistant

## 2022-09-23 ENCOUNTER — Ambulatory Visit (INDEPENDENT_AMBULATORY_CARE_PROVIDER_SITE_OTHER): Payer: BC Managed Care – PPO | Admitting: Physician Assistant

## 2022-09-23 VITALS — BP 110/66 | HR 68 | Temp 97.0°F | Ht 68.0 in | Wt 163.2 lb

## 2022-09-23 DIAGNOSIS — D492 Neoplasm of unspecified behavior of bone, soft tissue, and skin: Secondary | ICD-10-CM

## 2022-09-23 NOTE — Progress Notes (Signed)
   Acute Office Visit  Subjective:    Patient ID: Garrett Shea, male    DOB: Jan 21, 1961, 62 y.o.   MRN: [id number]  Chief Complaint  Patient presents with   Spot on left side of head    HPI: Patient is in today for complaints of scalp lesion.  He states he noted it about a month or so ago and is getting bigger.  States at times it is irritative.  He went to urgent care last week and was prescribed bactrim for it.  No improvement of symptoms    Current Outpatient Medications:    acetaminophen (TYLENOL) 500 MG tablet, Take 2 tablets (1,000 mg total) by mouth every 8 (eight) hours., Disp: 180 tablet, Rfl: 0   naproxen (NAPROSYN) 375 MG tablet, Take 1 tablet (375 mg total) by mouth 2 (two) times daily., Disp: 20 tablet, Rfl: 0   oxyCODONE (ROXICODONE) 5 MG immediate release tablet, Take 1 tablet (5 mg total) by mouth every 4 (four) hours as needed for severe pain., Disp: 10 tablet, Rfl: 0   pregabalin (LYRICA) 150 MG capsule, Take 1 capsule (150 mg total) by mouth 2 (two) times daily., Disp: 60 capsule, Rfl: 3   sulfamethoxazole-trimethoprim (BACTRIM DS) 800-160 MG tablet, Take 1 tablet by mouth 2 (two) times daily., Disp: , Rfl:    ALPRAZolam (XANAX) 0.5 MG tablet, Take 0.5 mg by mouth 2 (two) times daily as needed. (Patient not taking: Reported on 09/23/2022), Disp: , Rfl:   No Known Allergies  ROS CONSTITUTIONAL: Negative for chills, fatigue, fever, E/N/T: Negative for ear pain, nasal congestion and sore throat.  CARDIOVASCULAR: Negative for chest pain, RESPIRATORY: pt states has had slight cough this week GASTROINTESTINAL: Negative for abdominal pain,  INTEGUMENTARY: see HPI      Objective:  PHYSICAL EXAM:   VS: BP 110/66 (BP Location: Left Arm, Patient Position: Sitting, Cuff Size: Normal)   Pulse 68   Temp (!) 97 F (36.1 C) (Temporal)   Ht 5\' 8"  (1.727 m)   Wt 163 lb 3.2 oz (74 kg)   SpO2 98%   BMI 24.81 kg/m   GEN: Well nourished, well developed, in no acute distress   HEENT: normal external ears and nose - normal external auditory canals and TMS - - Lips, Teeth and Gums - normal  Oropharynx - normal mucosa, palate, and posterior pharynx Cardiac: RRR; no murmurs,  Respiratory:  normal respiratory rate and pattern with no distress - normal breath sounds with no rales, rhonchi, wheezes or rubs Skin:lesion on side of scalp about size of nickel with thickened tissue - no area of induration or infection - squamoproliferative   Assessment & Plan:    Atypical squamoproliferative skin lesion -     Ambulatory referral to Dermatology     Follow-up: Return if symptoms worsen or fail to improve.  An After Visit Summary was printed and given to the patient.  Jettie Pagan [facility] [phone]

## 2022-09-24 DIAGNOSIS — L821 Other seborrheic keratosis: Secondary | ICD-10-CM | POA: Diagnosis not present

## 2023-05-26 DIAGNOSIS — G629 Polyneuropathy, unspecified: Secondary | ICD-10-CM | POA: Diagnosis not present

## 2023-05-26 DIAGNOSIS — R5383 Other fatigue: Secondary | ICD-10-CM | POA: Diagnosis not present

## 2023-05-26 DIAGNOSIS — R030 Elevated blood-pressure reading, without diagnosis of hypertension: Secondary | ICD-10-CM | POA: Diagnosis not present

## 2023-05-26 DIAGNOSIS — M159 Polyosteoarthritis, unspecified: Secondary | ICD-10-CM | POA: Diagnosis not present

## 2023-06-09 DIAGNOSIS — R600 Localized edema: Secondary | ICD-10-CM | POA: Diagnosis not present

## 2023-06-09 DIAGNOSIS — G629 Polyneuropathy, unspecified: Secondary | ICD-10-CM | POA: Diagnosis not present

## 2023-06-09 DIAGNOSIS — R030 Elevated blood-pressure reading, without diagnosis of hypertension: Secondary | ICD-10-CM | POA: Diagnosis not present

## 2023-06-09 DIAGNOSIS — M159 Polyosteoarthritis, unspecified: Secondary | ICD-10-CM | POA: Diagnosis not present

## 2023-06-23 DIAGNOSIS — M159 Polyosteoarthritis, unspecified: Secondary | ICD-10-CM | POA: Diagnosis not present

## 2023-06-23 DIAGNOSIS — R030 Elevated blood-pressure reading, without diagnosis of hypertension: Secondary | ICD-10-CM | POA: Diagnosis not present

## 2023-06-23 DIAGNOSIS — R600 Localized edema: Secondary | ICD-10-CM | POA: Diagnosis not present

## 2023-06-23 DIAGNOSIS — G629 Polyneuropathy, unspecified: Secondary | ICD-10-CM | POA: Diagnosis not present

## 2023-07-02 DIAGNOSIS — M79641 Pain in right hand: Secondary | ICD-10-CM | POA: Diagnosis not present

## 2023-07-07 DIAGNOSIS — G629 Polyneuropathy, unspecified: Secondary | ICD-10-CM | POA: Diagnosis not present

## 2023-07-07 DIAGNOSIS — R030 Elevated blood-pressure reading, without diagnosis of hypertension: Secondary | ICD-10-CM | POA: Diagnosis not present

## 2023-07-07 DIAGNOSIS — M159 Polyosteoarthritis, unspecified: Secondary | ICD-10-CM | POA: Diagnosis not present

## 2023-07-07 DIAGNOSIS — R600 Localized edema: Secondary | ICD-10-CM | POA: Diagnosis not present

## 2023-09-24 ENCOUNTER — Other Ambulatory Visit: Payer: Self-pay

## 2023-09-24 DIAGNOSIS — I499 Cardiac arrhythmia, unspecified: Secondary | ICD-10-CM | POA: Insufficient documentation

## 2023-09-24 DIAGNOSIS — K219 Gastro-esophageal reflux disease without esophagitis: Secondary | ICD-10-CM | POA: Insufficient documentation

## 2023-09-24 DIAGNOSIS — M1990 Unspecified osteoarthritis, unspecified site: Secondary | ICD-10-CM | POA: Insufficient documentation

## 2023-09-24 DIAGNOSIS — N200 Calculus of kidney: Secondary | ICD-10-CM | POA: Insufficient documentation

## 2023-09-25 ENCOUNTER — Ambulatory Visit

## 2023-09-25 VITALS — BP 140/80 | HR 82 | Ht 68.0 in | Wt 184.4 lb

## 2023-09-25 DIAGNOSIS — R030 Elevated blood-pressure reading, without diagnosis of hypertension: Secondary | ICD-10-CM | POA: Insufficient documentation

## 2023-09-25 DIAGNOSIS — E785 Hyperlipidemia, unspecified: Secondary | ICD-10-CM

## 2023-09-25 DIAGNOSIS — R600 Localized edema: Secondary | ICD-10-CM

## 2023-09-25 DIAGNOSIS — I10 Essential (primary) hypertension: Secondary | ICD-10-CM | POA: Insufficient documentation

## 2023-09-25 DIAGNOSIS — I2510 Atherosclerotic heart disease of native coronary artery without angina pectoris: Secondary | ICD-10-CM

## 2023-09-25 HISTORY — DX: Hyperlipidemia, unspecified: E78.5

## 2023-09-25 HISTORY — DX: Elevated blood-pressure reading, without diagnosis of hypertension: R03.0

## 2023-09-25 HISTORY — DX: Localized edema: R60.0

## 2023-09-25 HISTORY — DX: Atherosclerotic heart disease of native coronary artery without angina pectoris: I25.10

## 2023-09-25 MED ORDER — ASPIRIN 81 MG PO TBEC: 81.0000 mg | DELAYED_RELEASE_TABLET | Freq: Every day | ORAL | 3 refills | Status: AC

## 2023-09-25 NOTE — Progress Notes (Addendum)
 Cardiology Consultation:    Date:  09/25/2023   ID:  Garrett Shea, DOB May 12, 1960, MRN [id number]  PCP:  Garrett Chow, MD  Cardiologist:  Garrett SAUNDERS Elianna Windom, MD   Referring MD: No ref. provider found   No chief complaint on file.    ASSESSMENT AND PLAN:   Garrett Shea 63 year old male with history of mild nonobstructive disease on cardiac cath 04/18/2016 [done in the setting of atypical chest pain and frequent PVCs and abnormal stress echocardiogram results], Holter monitor September 2022 at [facility] with rare ventricular and supraventricular ectopy burden and short runs of SVT up to 4 beats. Also obesity, was able to successfully lose 92 pounds with exercise over the past few years but over the past 1 year since right shoulder surgery and lack of exercise he has gained some weight.  Now here for further evaluation of bilateral lower extremity edema noticing intermittently being managed with Lasix as needed and for evaluation of atypical chest pain.  Also notes suboptimal blood pressure readings at home using his home device   Problem List Items Addressed This Visit     Elevated blood pressure reading without diagnosis of hypertension   Advised to continue maintaining log of blood pressure readings at home for the next couple weeks and if consistently above 130/80 mmHg would recommend antihypertensive medication such as losartan.       Relevant Orders   Pro b natriuretic peptide (BNP)   ECHOCARDIOGRAM COMPLETE   CAD (coronary artery disease)   Mild nonobstructive coronary artery disease on cardiac cath 04-18-2017 done at [facility] in the setting of atypical chest pain and frequent PVCs with abnormal stress echocardiogram at the time.  Has not had follow-ups with cardiology since then. Currently not even taking aspirin  at home.  Now with symptoms of bilateral lower extremity edema and suboptimal pressures, also noticing atypical symptoms of chest pain.  Advised him to start  taking aspirin  81 mg once daily. He is hesitant to start taking statins.  See discussion under hyperlipidemia.  Will review for any significant ischemia with treadmill stress test with nuclear imaging.       Relevant Medications   furosemide (LASIX) 20 MG tablet   aspirin  EC 81 MG tablet   Other Relevant Orders   EKG 12-Lead (Completed)   Lipid Profile   ECHOCARDIOGRAM COMPLETE   MYOCARDIAL PERFUSION IMAGING   Dyslipidemia   No recent lipid panel.  Will obtain 1 today.  In the setting of mild nonobstructive coronary artery disease benefits with statins in the form of reducing risk of MI, CVA and also preventing progression of plaque burden, recommended statin therapy.  If LDL above 70 mg/dL would strongly encourage statins.  Will address this further at subsequent follow-up.      Relevant Orders   Lipid Profile   Bilateral lower extremity edema - Primary   Mild, intermittent, symmetric.  Appears to respond to furosemide that he takes as needed.  No other significant signs or symptoms of heart failure.  Will obtain blood work with CMP, magnesium, proBNP.  Advised to reduce salt intake in the diet. Continue with furosemide 20 mg once a day to be used as needed..  Will obtain transthoracic echocardiogram to assess cardiac structure and function.        Relevant Orders   EKG 12-Lead (Completed)   Comp Met (CMET)   Magnesium   Pro b natriuretic peptide (BNP)   ECHOCARDIOGRAM COMPLETE    Return to clinic in 2  months.  Addendum: 10-13-2023 Insurance declined the request for stress test with nuclear imaging. Requesting for the reasoning and rationale for the stress test.  He does have mild nonobstructive coronary artery disease, prior stress echocardiogram was abnormal. No point in repeating a treadmill EKG stress test alone, will need additional imaging to be done.  Repeat echocardiogram now shows no major abnormalities which is reassuring. Reason for stress test  with atypical chest pain and above history of mild nonobstructive CAD and prior stress echocardiogram in 2019 that was abnormal.  Hence will need treadmill EKG stress test with nuclear imaging as discussed and ordered at the office visit.   History of Present Illness:    Garrett Shea is a 63 y.o. male who is being seen today for the evaluation of bilateral lower extremity edema, came in as a self-referral. His PCP is Garrett Jama MD.  Mild nonobstructive disease on cardiac cath 04/18/2016 [done in the setting of atypical chest pain and frequent PVCs], Holter monitor September 2022 at [facility] with rare ventricular and supraventricular ectopy burden and short runs of SVT up to 4 beats. Reports history of obesity and having lost 92 pounds subsequently with exercise 2 years ago, over the past year he has regained some weight since his right shoulder injury and surgery July 2024. Mentions he does not prefer to take medications and at home currently only taking Lasix as needed and oxycodone  as needed for peripheral neuropathy pain.   Here for the visit today by himself.  Lives by himself at home.  Currently separated from his wife but mentions the relationship is on the mend.  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  Stays physically active but no regular exercise since his shoulder injury.  Mentions over the last many months he has noticed symptoms of bilateral lower extremity edema which presents intermittently.  Has been using Lasix as needed. Denies any claudication symptoms. Also mentions symptoms of atypical chest pain or shortness of breath which occur randomly or during exertion and not sustained symptoms.  He has been extremely anxious about his health and wanted to make sure is doing well.  Has recently set up care with his PCP Dr. Jama earlier this year and had blood work, I do not have these to review myself today.  Blood pressures at home  checked using his home device notes readings ranging from systolic 120s to 809d.  Does not smoke. Drinks alcohol occasionally 1-2 shots. No other illicit drug use.   EKG in the clinic today shows sinus rhythm heart rate 82/min, PR interval 188 ms, QRS duration 86 ms, QTc 460 ms normal.   Past Medical History:  Diagnosis Date   Abnormal heart rhythm    Abnormal stress echocardiogram 04/12/2016   Formatting of this note might be different from the original.  Added automatically from request for surgery [id number]     Arthritis    Cervical radiculopathy 09/12/2015   Chest pain 04/12/2016   Formatting of this note might be different from the original.  Added automatically from request for surgery [id number]     Chronic bilateral low back pain without sciatica 06/06/2020   Chronic right shoulder pain 09/12/2015   Cyst of joint of hand, right 08/27/2017   Degeneration of intervertebral disc at L5-S1 level 06/19/2016   GERD (gastroesophageal reflux disease)    Kidney stones    Knee pain 07/02/2012   Left lower quadrant pain 08/27/2017   Leg  cramps 06/06/2020   Neuropathy 08/27/2017   Premature ventricular contractions 04/12/2016   Formatting of this note might be different from the original.  Added automatically from request for surgery [id number]     Screening PSA (prostate specific antigen) 08/27/2017   Well adult on routine health check 08/27/2017    Past Surgical History:  Procedure Laterality Date   KNEE SURGERY Bilateral    SHOULDER SURGERY Left     Current Medications: Current Meds  Medication Sig   aspirin  EC 81 MG tablet Take 1 tablet (81 mg total) by mouth daily. Swallow whole.   furosemide (LASIX) 20 MG tablet Take 20 mg by mouth as needed for fluid or edema.   oxyCODONE  (ROXICODONE ) 5 MG immediate release tablet Take 1 tablet (5 mg total) by mouth every 4 (four) hours as needed for severe pain.   pregabalin  (LYRICA ) 150 MG capsule Take 1 capsule (150 mg total) by mouth 2  (two) times daily.     Allergies:   Patient has no known allergies.   Social History   Socioeconomic History   Marital status: Married    Spouse name: Not on file   Number of children: Not on file   Years of education: Not on file   Highest education level: Not on file  Occupational History   Not on file  Tobacco Use   Smoking status: Never   Smokeless tobacco: Never  Vaping Use   Vaping status: Never Used  Substance and Sexual Activity   Alcohol use: Yes   Drug use: Never   Sexual activity: Not on file  Other Topics Concern   Not on file  Social History Narrative   Not on file   Social Drivers of Health   Financial Resource Strain: Low Risk  (09/23/2022)   Overall Financial Resource Strain (CARDIA)    Difficulty of Paying Living Expenses: Not hard at all  Food Insecurity: No Food Insecurity (09/23/2022)   Hunger Vital Sign    Worried About Running Out of Food in the Last Year: Never true    Ran Out of Food in the Last Year: Never true  Transportation Needs: No Transportation Needs (09/23/2022)   PRAPARE - Administrator, Civil Service (Medical): No    Lack of Transportation (Non-Medical): No  Physical Activity: Inactive (09/23/2022)   Exercise Vital Sign    Days of Exercise per Week: 0 days    Minutes of Exercise per Session: 0 min  Stress: No Stress Concern Present (09/23/2022)   [employer] of Occupational Health - Occupational Stress Questionnaire    Feeling of Stress : Not at all  Social Connections: Moderately Isolated (09/23/2022)   Social Connection and Isolation Panel    Frequency of Communication with Friends and Family: More than three times a week    Frequency of Social Gatherings with Friends and Family: Twice a week    Attends Religious Services: More than 4 times per year    Active Member of [employer] or Organizations: No    Attends Banker Meetings: Never    Marital Status: Divorced     Family History: The patient's  family history includes Cancer in his father. ROS:   Please see the history of present illness.    All 14 point review of systems negative except as described per history of present illness.  EKGs/Labs/Other Studies Reviewed:    The following studies were reviewed today:   EKG:  EKG Interpretation Date/Time:  Thursday September 25 2023 13:33:36 EDT Ventricular Rate:  82 PR Interval:  188 QRS Duration:  86 QT Interval:  394 QTC Calculation: 460 R Axis:   102  Text Interpretation: Normal sinus rhythm Rightward axis Possible Anterior infarct , age undetermined Abnormal ECG No previous ECGs available Confirmed by Liborio Hai reddy [id number]) on 09/25/2023 1:36:58 PM    Recent Labs: No results found for requested labs within last 365 days.  Recent Lipid Panel No results found for: CHOL, TRIG, HDL, CHOLHDL, VLDL, LDLCALC, LDLDIRECT  Physical Exam:    VS:  BP (!) 140/80   Pulse 82   Ht 5' 8 (1.727 m)   Wt 184 lb 6.4 oz (83.6 kg)   SpO2 97%   BMI 28.04 kg/m     Wt Readings from Last 3 Encounters:  09/25/23 184 lb 6.4 oz (83.6 kg)  09/23/22 163 lb 3.2 oz (74 kg)  08/27/22 170 lb (77.1 kg)     GENERAL:  Well nourished, well developed in no acute distress NECK: No JVD; No carotid bruits CARDIAC: RRR, S1 and S2 present, no murmurs, no rubs, no gallops CHEST:  Clear to auscultation without rales, wheezing or rhonchi  Extremities: Trace bilateral pitting ankle edema. Pulses bilaterally symmetric with radial 2+ and dorsalis pedis 2+ NEUROLOGIC:  Alert and oriented x 3  Medication Adjustments/Labs and Tests Ordered: Current medicines are reviewed at length with the patient today.  Concerns regarding medicines are outlined above.  Orders Placed This Encounter  Procedures   Comp Met (CMET)   Magnesium   Pro b natriuretic peptide (BNP)   Lipid Profile   MYOCARDIAL PERFUSION IMAGING   EKG 12-Lead   ECHOCARDIOGRAM COMPLETE   Meds ordered this encounter   Medications   aspirin  EC 81 MG tablet    Sig: Take 1 tablet (81 mg total) by mouth daily. Swallow whole.    Dispense:  90 tablet    Refill:  3    Signed, Jadarrius Maselli reddy Maisa Bedingfield, MD, MPH, [facility] ([place]). 09/25/2023 2:07 PM    [facility]

## 2023-09-25 NOTE — Assessment & Plan Note (Signed)
 No recent lipid panel.  Will obtain 1 today.  In the setting of mild nonobstructive coronary artery disease benefits with statins in the form of reducing risk of MI, CVA and also preventing progression of plaque burden, recommended statin therapy.  If LDL above 70 mg/dL would strongly encourage statins.  Will address this further at subsequent follow-up.

## 2023-09-25 NOTE — Assessment & Plan Note (Signed)
 Mild, intermittent, symmetric.  Appears to respond to furosemide that he takes as needed.  No other significant signs or symptoms of heart failure.  Will obtain blood work with CMP, magnesium, proBNP.  Advised to reduce salt intake in the diet. Continue with furosemide 20 mg once a day to be used as needed..  Will obtain transthoracic echocardiogram to assess cardiac structure and function.

## 2023-09-25 NOTE — Assessment & Plan Note (Signed)
 Advised to continue maintaining log of blood pressure readings at home for the next couple weeks and if consistently above 130/80 mmHg would recommend antihypertensive medication such as losartan.

## 2023-09-25 NOTE — Assessment & Plan Note (Signed)
 Mild nonobstructive coronary artery disease on cardiac cath 04-18-2017 done at [facility] in the setting of atypical chest pain and frequent PVCs with abnormal stress echocardiogram at the time.  Has not had follow-ups with cardiology since then. Currently not even taking aspirin at home.  Now with symptoms of bilateral lower extremity edema and suboptimal pressures, also noticing atypical symptoms of chest pain.  Advised him to start taking aspirin 81 mg once daily. He is hesitant to start taking statins.  See discussion under hyperlipidemia.  Will review for any significant ischemia with treadmill stress test with nuclear imaging.

## 2023-09-25 NOTE — Patient Instructions (Signed)
 Medication Instructions:  Your physician has recommended you make the following change in your medication:   START: Aspirin 81 mg daily  *If you need a refill on your cardiac medications before your next appointment, please call your pharmacy*  Lab Work: Your physician recommends that you return for lab work in:   Labs today: CMP, Magnesium, Pro BNP, Lipids  If you have labs (blood work) drawn today and your tests are completely normal, you will receive your results only by: MyChart Message (if you have MyChart) OR A paper copy in the mail If you have any lab test that is abnormal or we need to change your treatment, we will call you to review the results.  Testing/Procedures: Your physician has requested that you have an echocardiogram. Echocardiography is a painless test that uses sound waves to create images of your heart. It provides your doctor with information about the size and shape of your heart and how well your heart's chambers and valves are working. This procedure takes approximately one hour. There are no restrictions for this procedure. Please do NOT wear cologne, perfume, aftershave, or lotions (deodorant is allowed). Please arrive 15 minutes prior to your appointment time.  Please note: We ask at that you not bring children with you during ultrasound (echo/ vascular) testing. Due to room size and safety concerns, children are not allowed in the ultrasound rooms during exams. Our front office staff cannot provide observation of children in our lobby area while testing is being conducted. An adult accompanying a patient to their appointment will only be allowed in the ultrasound room at the discretion of the ultrasound technician under special circumstances. We apologize for any inconvenience.    [facility] [address] [place], [place] [address] Phone:  [phone]    Please arrive 15 minutes prior to your appointment time for  registration and insurance purposes.  The test will take approximately 3 to 4 hours to complete; you may bring reading material.  If someone comes with you to your appointment, they will need to remain in the main lobby due to limited space in the testing area. **If you are pregnant or breastfeeding, please notify the nuclear lab prior to your appointment**  How to prepare for your Myocardial Perfusion Test: Do not eat or drink 3 hours prior to your test, except you may have water. Do not consume products containing caffeine (regular or decaffeinated) 12 hours prior to your test. (ex: coffee, chocolate, sodas, tea). Do wear comfortable clothes (no dresses or overalls) and walking shoes, tennis shoes preferred (No heels or open toe shoes are allowed). Do NOT wear cologne, perfume, aftershave, or lotions (deodorant is allowed). If these instructions are not followed, your test will have to be rescheduled.  Please report to [address] for your test.  If you have questions or concerns about your appointment, you can call the [facility] at [phone].  If you cannot keep your appointment, please provide 24 hours notification to the Nuclear Lab, to avoid a possible $50 charge to your account.   Follow-Up: At [facility], you and your health needs are our priority.  As part of our continuing mission to provide you with exceptional heart care, our providers are all part of one team.  This team includes your primary Cardiologist (physician) and Advanced Practice Providers or APPs (Physician Assistants and Nurse Practitioners) who all work together to provide you with the care you need,  when you need it.  Your next appointment:   2 month(s)  Provider:   Bertha Broad, MD    We recommend signing up for the patient portal called MyChart.  Sign up information is provided on this After Visit Summary.  MyChart is used to connect with patients  for Virtual Visits (Telemedicine).  Patients are able to view lab/test results, encounter notes, upcoming appointments, etc.  Non-urgent messages can be sent to your provider as well.   To learn more about what you can do with MyChart, go to [email or web address].   Other Instructions Please keep a BP log for 2 weeks and send by MyChart or mail.                          Name and DOB__________________________ Dr. Madireddy [address] [place], [place] [address]  Blood Pressure Record Sheet To take your blood pressure, you will need a blood pressure machine. You can buy a blood pressure machine (blood pressure monitor) at your clinic, drug store, or online. When choosing one, consider: An automatic monitor that has an arm cuff. A cuff that wraps snugly around your upper arm. You should be able to fit only one finger between your arm and the cuff. A device that stores blood pressure reading results. Do not choose a monitor that measures your blood pressure from your wrist or finger. Follow your health care provider's instructions for how to take your blood pressure. To use this form: Get one reading in the morning (a.m.) 1-2 hours after you take any medicines. Get one reading in the evening (p.m.) before supper.   Blood pressure log Date: _______________________  a.m. _____________________(1st reading) HR___________            p.m. _____________________(2nd reading) HR__________  Date: _______________________  a.m. _____________________(1st reading) HR___________            p.m. _____________________(2nd reading) HR__________  Date: _______________________  a.m. _____________________(1st reading) HR___________            p.m. _____________________(2nd reading) HR__________  Date: _______________________  a.m. _____________________(1st reading) HR___________            p.m. _____________________(2nd reading) HR__________  Date: _______________________  a.m.  _____________________(1st reading) HR___________            p.m. _____________________(2nd reading) HR__________  Date: _______________________  a.m. _____________________(1st reading) HR___________            p.m. _____________________(2nd reading) HR__________  Date: _______________________  a.m. _____________________(1st reading) HR___________            p.m. _____________________(2nd reading) HR__________   This information is not intended to replace advice given to you by your health care provider. Make sure you discuss any questions you have with your health care provider. Document Revised: 07/21/2019 Document Reviewed: 07/21/2019 Elsevier Patient Education  2021 [employer].

## 2023-09-26 ENCOUNTER — Ambulatory Visit: Payer: Self-pay

## 2023-09-26 ENCOUNTER — Ambulatory Visit

## 2023-09-26 DIAGNOSIS — R600 Localized edema: Secondary | ICD-10-CM | POA: Insufficient documentation

## 2023-09-26 DIAGNOSIS — I2510 Atherosclerotic heart disease of native coronary artery without angina pectoris: Secondary | ICD-10-CM | POA: Insufficient documentation

## 2023-09-26 DIAGNOSIS — R030 Elevated blood-pressure reading, without diagnosis of hypertension: Secondary | ICD-10-CM | POA: Insufficient documentation

## 2023-09-26 LAB — ECHOCARDIOGRAM COMPLETE
Area-P 1/2: 3.77 cm2
S' Lateral: 2.8 cm

## 2023-09-27 LAB — LIPID PANEL
Chol/HDL Ratio: 3.3 ratio (ref 0.0–5.0)
Cholesterol, Total: 190 mg/dL (ref 100–199)
HDL: 58 mg/dL (ref >39)
LDL Chol Calc (NIH): 116 mg/dL — ABNORMAL HIGH (ref 0–99)
Triglycerides: 89 mg/dL (ref 0–149)
VLDL Cholesterol Cal: 16 mg/dL (ref 5–40)

## 2023-09-27 LAB — COMPREHENSIVE METABOLIC PANEL WITH GFR
ALT: 23 IU/L (ref 0–44)
AST: 35 IU/L (ref 0–40)
Albumin: 5.1 g/dL — ABNORMAL HIGH (ref 3.9–4.9)
Alkaline Phosphatase: 140 IU/L — ABNORMAL HIGH (ref 44–121)
BUN/Creatinine Ratio: 13 (ref 10–24)
BUN: 11 mg/dL (ref 8–27)
Bilirubin Total: 0.7 mg/dL (ref 0.0–1.2)
CO2: 18 mmol/L — ABNORMAL LOW (ref 20–29)
Calcium: 9.4 mg/dL (ref 8.6–10.2)
Chloride: 108 mmol/L — ABNORMAL HIGH (ref 96–106)
Creatinine, Ser: 0.87 mg/dL (ref 0.76–1.27)
Globulin, Total: 2.1 g/dL (ref 1.5–4.5)
Glucose: 92 mg/dL (ref 70–99)
Potassium: 4.5 mmol/L (ref 3.5–5.2)
Sodium: 146 mmol/L — ABNORMAL HIGH (ref 134–144)
Total Protein: 7.2 g/dL (ref 6.0–8.5)
eGFR: 98 mL/min/{1.73_m2} (ref >59)

## 2023-09-27 LAB — PRO B NATRIURETIC PEPTIDE: NT-Pro BNP: 77 pg/mL (ref 0–210)

## 2023-09-27 LAB — MAGNESIUM: Magnesium: 2.4 mg/dL — ABNORMAL HIGH (ref 1.6–2.3)

## 2023-09-29 ENCOUNTER — Ambulatory Visit: Admitting: Cardiology

## 2023-10-01 ENCOUNTER — Encounter: Payer: Self-pay | Admitting: *Deleted

## 2023-10-01 ENCOUNTER — Telehealth (HOSPITAL_COMMUNITY): Payer: Self-pay | Admitting: *Deleted

## 2023-10-01 NOTE — Telephone Encounter (Signed)
 Reminder letter with instructions for upcoming stress test on 10/07/23 sent via [employer].

## 2023-10-07 ENCOUNTER — Ambulatory Visit

## 2023-10-07 ENCOUNTER — Telehealth: Payer: Self-pay | Admitting: *Deleted

## 2023-10-07 NOTE — Telephone Encounter (Signed)
 Left detailed instructions on cell # for MPI study.

## 2023-10-11 ENCOUNTER — Ambulatory Visit (HOSPITAL_BASED_OUTPATIENT_CLINIC_OR_DEPARTMENT_OTHER)
Admission: RE | Admit: 2023-10-11 | Discharge: 2023-10-11 | Disposition: A | Discharge status: Home / Self Care | Source: Ambulatory Visit | Attending: Family Medicine | Admitting: Family Medicine

## 2023-10-11 ENCOUNTER — Encounter (HOSPITAL_BASED_OUTPATIENT_CLINIC_OR_DEPARTMENT_OTHER): Payer: Self-pay

## 2023-10-11 ENCOUNTER — Ambulatory Visit (HOSPITAL_BASED_OUTPATIENT_CLINIC_OR_DEPARTMENT_OTHER)
Admit: 2023-10-11 | Discharge: 2023-10-11 | Disposition: A | Discharge status: Home / Self Care | Attending: Family Medicine | Admitting: Radiology

## 2023-10-11 VITALS — BP 120/77 | HR 71 | Temp 98.3°F | Resp 18

## 2023-10-11 DIAGNOSIS — S20312A Abrasion of left front wall of thorax, initial encounter: Secondary | ICD-10-CM

## 2023-10-11 DIAGNOSIS — R0781 Pleurodynia: Secondary | ICD-10-CM | POA: Diagnosis not present

## 2023-10-11 DIAGNOSIS — S20212A Contusion of left front wall of thorax, initial encounter: Secondary | ICD-10-CM | POA: Diagnosis not present

## 2023-10-11 MED ORDER — CELECOXIB 200 MG PO CAPS: 200.0000 mg | ORAL_CAPSULE | Freq: Two times a day (BID) | ORAL | 0 refills | Status: AC | PRN

## 2023-10-11 MED ORDER — DICLOFENAC SODIUM 75 MG PO TBEC: 75.0000 mg | DELAYED_RELEASE_TABLET | Freq: Two times a day (BID) | ORAL | 0 refills | Status: DC | PRN
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
## 2023-10-11 NOTE — Discharge Instructions (Addendum)
 Left rib contusion: X-ray appears negative.  Will contact the patient if the radiology review differs from my impression.  Use ice or heat, as needed for pain relief.  Handout provided on RICE therapy.  Diclofenac 75 mg twice daily with food if needed for pain.    Follow-up if symptoms do not improve, worsen or new symptoms occur.
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
## 2023-10-14 ENCOUNTER — Ambulatory Visit

## 2023-10-28 ENCOUNTER — Encounter

## 2023-10-30 ENCOUNTER — Telehealth (HOSPITAL_COMMUNITY): Payer: Self-pay | Admitting: *Deleted

## 2023-10-30 NOTE — Telephone Encounter (Signed)
 Spoke to patient to remind him about his STRESS TEST on 11/05/23 at 8:00.

## 2023-11-05 ENCOUNTER — Ambulatory Visit

## 2023-11-05 DIAGNOSIS — I2510 Atherosclerotic heart disease of native coronary artery without angina pectoris: Secondary | ICD-10-CM | POA: Insufficient documentation

## 2023-11-05 MED ORDER — TECHNETIUM TC 99M TETROFOSMIN IV KIT
28.9000 | PACK | Freq: Once | INTRAVENOUS | Status: AC | PRN
  Administered 2023-11-05: 28.9 via INTRAVENOUS

## 2023-11-05 MED ORDER — TECHNETIUM TC 99M TETROFOSMIN IV KIT
10.9000 | PACK | Freq: Once | INTRAVENOUS | Status: AC | PRN
  Administered 2023-11-05: 10.9 via INTRAVENOUS

## 2023-11-06 LAB — MYOCARDIAL PERFUSION IMAGING
Angina Index: 0
Estimated workload: 10.1
Exercise duration (min): 7 min
Exercise duration (sec): 16 s
LV dias vol: 92 mL (ref 62–150)
LV sys vol: 26 mL (ref 4.2–5.8)
MPHR: 158 {beats}/min
Nuc Stress EF: 72 %
Peak HR: 142 {beats}/min
Percent HR: 89 %
Rest HR: 73 {beats}/min
Rest Nuclear Isotope Dose: 10.9 mCi
SDS: 2
SRS: 0
SSS: 2
Stress Nuclear Isotope Dose: 28.9 mCi
TID: 0.84

## 2023-12-01 ENCOUNTER — Ambulatory Visit

## 2023-12-02 DIAGNOSIS — F4325 Adjustment disorder with mixed disturbance of emotions and conduct: Secondary | ICD-10-CM | POA: Insufficient documentation

## 2023-12-03 DIAGNOSIS — F1290 Cannabis use, unspecified, uncomplicated: Secondary | ICD-10-CM | POA: Insufficient documentation

## 2023-12-03 DIAGNOSIS — S62308A Unspecified fracture of other metacarpal bone, initial encounter for closed fracture: Secondary | ICD-10-CM | POA: Insufficient documentation

## 2023-12-03 DIAGNOSIS — Z8669 Personal history of other diseases of the nervous system and sense organs: Secondary | ICD-10-CM | POA: Insufficient documentation

## 2023-12-03 DIAGNOSIS — Z9189 Other specified personal risk factors, not elsewhere classified: Secondary | ICD-10-CM | POA: Insufficient documentation

## 2023-12-03 DIAGNOSIS — Z602 Problems related to living alone: Secondary | ICD-10-CM | POA: Insufficient documentation

## 2023-12-10 ENCOUNTER — Ambulatory Visit

## 2023-12-10 VITALS — BP 134/72 | HR 67 | Ht 68.0 in | Wt 177.8 lb

## 2023-12-10 DIAGNOSIS — I2510 Atherosclerotic heart disease of native coronary artery without angina pectoris: Secondary | ICD-10-CM | POA: Insufficient documentation

## 2023-12-10 DIAGNOSIS — E782 Mixed hyperlipidemia: Secondary | ICD-10-CM | POA: Diagnosis not present

## 2023-12-10 DIAGNOSIS — R002 Palpitations: Secondary | ICD-10-CM | POA: Insufficient documentation

## 2023-12-10 DIAGNOSIS — I10 Essential (primary) hypertension: Secondary | ICD-10-CM | POA: Diagnosis not present

## 2023-12-10 MED ORDER — ROSUVASTATIN CALCIUM 5 MG PO TABS: 5.0000 mg | ORAL_TABLET | ORAL | 3 refills | Status: AC

## 2023-12-10 MED ORDER — LOSARTAN POTASSIUM 25 MG PO TABS: 25.0000 mg | ORAL_TABLET | Freq: Every day | ORAL | 3 refills | Status: AC

## 2023-12-10 NOTE — Assessment & Plan Note (Addendum)
 Lipid panel 09/26/2023 total cholesterol 190, triglycerides 89, LDL 116, HDL 58. Suboptimal LDL in the setting of mild nonobstructive CAD. Would recommend statin therapy.  Discussed the rationale for both lipid-lowering and for plaque maturation to prevent cardiovascular events. Discussed potential side effects. He is agreeable.  Will prescribe rosuvastatin  5 mg to start out 3 times a week for the first month and gradually escalate the frequency each month.

## 2023-12-10 NOTE — Assessment & Plan Note (Signed)
 Elevated blood pressures, notably hypertensive blood pressure response with exercise stress test recently.  Target blood pressure below 130/80 mmHg. Discussed various antihypertensive medications. Prescribed losartan  25 mg once daily. Repeat blood work basic metabolic panel in 1 month after starting losartan  to monitor electrolytes and kidney function.

## 2023-12-10 NOTE — Addendum Note (Signed)
 Addended by: SHERRE ADE I on: 12/10/2023 02:56 PM   Modules accepted: Orders

## 2023-12-10 NOTE — Assessment & Plan Note (Addendum)
 Mild nonobstructive CAD on cardiac cath 04/18/2016 at [facility] in the setting of atypical chest pain, frequent PVCs and abnormal stress echocardiogram]. Treadmill EKG stress test with nuclear imaging 11/05/2023, good exercise capacity, no ischemic changes on EKG or nuclear imaging.  Hypertensive blood pressure response.  Reassuring results on recent stress test. Continue to take aspirin  81 mg once daily if no contraindication given his mild nonobstructive CAD. Adding statins for overall cardiovascular risk reduction as discussed under hyperlipidemia.

## 2023-12-10 NOTE — Patient Instructions (Signed)
 Medication Instructions:  Your physician has recommended you make the following change in your medication:   START: Losartan  25 mg daily START: Crestor  5 mg every M - W - F  *If you need a refill on your cardiac medications before your next appointment, please call your pharmacy*  Lab Work: None If you have labs (blood work) drawn today and your tests are completely normal, you will receive your results only by: MyChart Message (if you have MyChart) OR A paper copy in the mail If you have any lab test that is abnormal or we need to change your treatment, we will call you to review the results.  Testing/Procedures: A zio monitor was ordered today. It will remain on for 14 days. You will then return monitor and event diary in provided box. It takes 1-2 weeks for report to be downloaded and returned to us . We will call you with the results. If monitor falls off or has orange flashing light, please call Zio for further instructions.    Follow-Up: At [facility], you and your health needs are our priority.  As part of our continuing mission to provide you with exceptional heart care, our providers are all part of one team.  This team includes your primary Cardiologist (physician) and Advanced Practice Providers or APPs (Physician Assistants and Nurse Practitioners) who all work together to provide you with the care you need, when you need it.  Your next appointment:   3 month(s)  Provider:   Alean Kobus, MD    We recommend signing up for the patient portal called MyChart.  Sign up information is provided on this After Visit Summary.  MyChart is used to connect with patients for Virtual Visits (Telemedicine).  Patients are able to view lab/test results, encounter notes, upcoming appointments, etc.  Non-urgent messages can be sent to your provider as well.   To learn more about what you can do with MyChart, go to [email or web address].   Other Instructions None

## 2023-12-10 NOTE — Progress Notes (Signed)
 Cardiology Consultation:    Date:  12/10/2023   ID:  Garrett Shea, DOB 14-Jan-1961, MRN [id number]  PCP:  Garrett Chow, MD  Cardiologist:  Garrett SAUNDERS Luceil Herrin, MD   Referring MD: Garrett Chow, MD   No chief complaint on file.    ASSESSMENT AND PLAN:   [name] 63 year old male  history of mild nonobstructive CAD on cardiac cath 04/18/2016 [facility] health, done in the setting of atypical chest pain, frequent PVCs and abnormal stress echocardiogram], obesity [had success with weight loss couple years ago but regained weight since his right shoulder injury and lack of exercise], peripheral neuropathy, anxiety, chronic history of intermittent bilateral lower extremity edema for which he uses Lasix as needed. Does not smoke. Drinks alcohol occasionally.  No illicit drug use. Transthoracic echocardiogram 09/26/2023 noted normal biventricular function with LVEF 60 to 65%, normal diastolic function, no significant valve abnormalities. Exercise stress test with nuclear imaging 11/05/2023 noted good exercise capacity [7 minutes 16 seconds exercise duration, 10.1 METS, 89% MPHR], no ischemia based on EKG or nuclear imaging portion of the study.  Blood pressure response was hypertensive.    Problem List Items Addressed This Visit     Hypertension   Elevated blood pressures, notably hypertensive blood pressure response with exercise stress test recently.  Target blood pressure below 130/80 mmHg. Discussed various antihypertensive medications. Prescribed losartan  25 mg once daily. Repeat blood work basic metabolic panel in 1 month after starting losartan  to monitor electrolytes and kidney function.       Relevant Medications   furosemide (LASIX) 40 MG tablet   losartan  (COZAAR ) 25 MG tablet   rosuvastatin  (CRESTOR ) 5 MG tablet   CAD (coronary artery disease) - Primary   Mild nonobstructive CAD on cardiac cath 04/18/2016 at [facility] in the setting of atypical chest pain, frequent PVCs and  abnormal stress echocardiogram]. Treadmill EKG stress test with nuclear imaging 11/05/2023, good exercise capacity, no ischemic changes on EKG or nuclear imaging.  Hypertensive blood pressure response.  Reassuring results on recent stress test. Continue to take aspirin  81 mg once daily if no contraindication given his mild nonobstructive CAD. Adding statins for overall cardiovascular risk reduction as discussed under hyperlipidemia.       Relevant Medications   furosemide (LASIX) 40 MG tablet   losartan  (COZAAR ) 25 MG tablet   rosuvastatin  (CRESTOR ) 5 MG tablet   Hyperlipidemia   Lipid panel 09/26/2023 total cholesterol 190, triglycerides 89, LDL 116, HDL 58. Suboptimal LDL in the setting of mild nonobstructive CAD. Would recommend statin therapy.  Discussed the rationale for both lipid-lowering and for plaque maturation to prevent cardiovascular events. Discussed potential side effects. He is agreeable.  Will prescribe rosuvastatin  5 mg to start out 3 times a week for the first month and gradually escalate the frequency each month.        Relevant Medications   furosemide (LASIX) 40 MG tablet   losartan  (COZAAR ) 25 MG tablet   rosuvastatin  (CRESTOR ) 5 MG tablet   Palpitation   Intermittent episodes of throbbing sensation in the chart. This could be related to uncontrolled blood pressures but also could be a sign of underlying ectopic beats.  He reportedly had history of PVCs in the past. Will proceed with Zio patch for 2 weeks.       Relevant Orders   LONG TERM MONITOR (3-14 DAYS)   Return to clinic tentatively in 3 months.    History of Present Illness:    Garrett  Shea is a 62 y.o. male who is being seen today for follow-up visit. PCP is Garrett Chow, MD. Last visit with me in the office was 09/25/2023.  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  Recently was admitted 12/02/2023 for behavioral health issues after making statements about  self-harm.  Discharged 12/04/2023.  Has history of mild nonobstructive CAD on cardiac cath 04/18/2016 [facility], done in the setting of atypical chest pain, frequent PVCs and abnormal stress echocardiogram], obesity [had success with weight loss couple years ago but regained weight since his right shoulder injury and lack of exercise], peripheral neuropathy, anxiety, chronic history of intermittent bilateral lower extremity edema for which he uses Lasix as needed. Does not smoke. Drinks alcohol occasionally.  No illicit drug use. Transthoracic echocardiogram 09/26/2023 noted normal biventricular function with LVEF 60 to 65%, normal diastolic function, no significant valve abnormalities. Exercise stress test with nuclear imaging 11/05/2023 noted good exercise capacity [7 minutes 16 seconds, attaining 89% MPHR, 10.1 METS], no ischemia based on EKG or nuclear imaging portion of the study.  Blood pressure response was hypertensive.  Mentions overall he is currently feeling well. Had a broken metacarpal bone on the right hand.  Does not have a cast on right now but has 1 at home that he forgot to put on before coming in for the visit.  Continues to monitor blood pressures at home intermittently. At times elevated blood pressures but overall improved compared to few weeks ago. Currently not on any blood pressure lowering medications.  Describes at times sensation of thumping sensation in the heart and this occurs on a regular basis and lasted all day yesterday. No sustained episodes, no lightheadedness, dizziness or syncopal episodes.  Lipid panel 09/26/2023 total cholesterol 190, triglycerides 89, LDL 116, HDL 58.  LDL suboptimal considering mild nonobstructive CAD.  NT proBNP was normal 77. Magnesium was 2.4. Recent CMP from 12/03/2023 normal transaminases, alkaline phosphatase mildly elevated 130. BUN 10, creatinine 0.85, EGFR 98 CBC unremarkable  Past Medical History:  Diagnosis Date    Abnormal heart rhythm    Abnormal stress echocardiogram 04/12/2016   Formatting of this note might be different from the original.  Added automatically from request for surgery [id number]     Arthritis    Bilateral lower extremity edema 09/25/2023   CAD (coronary artery disease) 09/25/2023   Cervical radiculopathy 09/12/2015   Chest pain 04/12/2016   Formatting of this note might be different from the original.  Added automatically from request for surgery [id number]     Chronic bilateral low back pain without sciatica 06/06/2020   Chronic right shoulder pain 09/12/2015   Cyst of joint of hand, right 08/27/2017   Degeneration of intervertebral disc at L5-S1 level 06/19/2016   Dyslipidemia 09/25/2023   Elevated blood pressure reading without diagnosis of hypertension 09/25/2023   Fracture of fifth metacarpal bone 12/03/2023   GERD (gastroesophageal reflux disease)    Has access to firearm 12/03/2023   History of peripheral neuropathy 12/03/2023   Kidney stones    Knee pain 07/02/2012   Left lower quadrant pain 08/27/2017   Leg cramps 06/06/2020   Lives alone 12/03/2023   Marijuana use 12/03/2023   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  Added automatically from request for surgery [id number]     Screening PSA (prostate  specific antigen) 08/27/2017   Well adult on routine health check 08/27/2017    Past Surgical History:  Procedure Laterality Date   KNEE SURGERY Bilateral    SHOULDER SURGERY Left     Current Medications: Current Meds  Medication Sig   aspirin  EC 81 MG tablet Take 1 tablet (81 mg total) by mouth daily. Swallow whole.   furosemide (LASIX) 40 MG tablet Take 40 mg by mouth 2 (two) times daily.   losartan  (COZAAR ) 25 MG tablet Take 1 tablet (25 mg total) by mouth daily.   oxyCODONE  (ROXICODONE ) 5 MG  immediate release tablet Take 1 tablet (5 mg total) by mouth every 4 (four) hours as needed for severe pain.   pregabalin  (LYRICA ) 100 MG capsule Take 100 mg by mouth 3 (three) times daily.   rosuvastatin  (CRESTOR ) 5 MG tablet Take 1 tablet (5 mg total) by mouth every Monday, Wednesday, and Friday.     Allergies:   Patient has no known allergies.   Social History   Socioeconomic History   Marital status: Married    Spouse name: Not on file   Number of children: Not on file   Years of education: Not on file   Highest education level: Not on file  Occupational History   Not on file  Tobacco Use   Smoking status: Never   Smokeless tobacco: Never  Vaping Use   Vaping status: Never Used  Substance and Sexual Activity   Alcohol use: Yes   Drug use: Never   Sexual activity: Not on file  Other Topics Concern   Not on file  Social History Narrative   Not on file   Social Drivers of Health   Financial Resource Strain: Low Risk  (09/23/2022)   Overall Financial Resource Strain (CARDIA)    Difficulty of Paying Living Expenses: Not hard at all  Food Insecurity: No Food Insecurity (09/23/2022)   Hunger Vital Sign    Worried About Running Out of Food in the Last Year: Never true    Ran Out of Food in the Last Year: Never true  Transportation Needs: No Transportation Needs (09/23/2022)   PRAPARE - Administrator, Civil Service (Medical): No    Lack of Transportation (Non-Medical): No  Physical Activity: Sufficiently Active (12/02/2023)   Received from [facility]   Exercise Vital Sign    On average, how many days per week do you engage in moderate to strenuous exercise (like a brisk walk)?: 7 days    On average, how many minutes do you engage in exercise at this level?: 30 min  Stress: No Stress Concern Present (09/23/2022)   [employer] of Occupational Health - Occupational Stress Questionnaire    Feeling of Stress : Not at all  Social Connections: Moderately  Integrated (12/02/2023)   Received from [facility]   Social Connection and Isolation Panel    In a typical week, how many times do you talk on the phone with family, friends, or neighbors?: More than three times a week    How often do you get together with friends or relatives?: Once a week    How often do you attend church or religious services?: More than 4 times per year    Do you belong to any clubs or organizations such as church groups, unions, fraternal or athletic groups, or school groups?: No    How often do you attend meetings of the clubs or organizations you belong to?: Never    Are  you married, widowed, divorced, separated, never married, or living with a partner?: Married     Family History: The patient's family history includes Cancer in his father. ROS:   Please see the history of present illness.    All 14 point review of systems negative except as described per history of present illness.  EKGs/Labs/Other Studies Reviewed:    The following studies were reviewed today:   EKG:       Recent Labs: 09/26/2023: ALT 23; BUN 11; Creatinine, Ser 0.87; Magnesium 2.4; NT-Pro BNP 77; Potassium 4.5; Sodium 146  Recent Lipid Panel    Component Value Date/Time   CHOL 190 09/26/2023 1443   TRIG 89 09/26/2023 1443   HDL 58 09/26/2023 1443   CHOLHDL 3.3 09/26/2023 1443   LDLCALC 116 (H) 09/26/2023 1443    Physical Exam:    VS:  BP 134/72   Pulse 67   Ht 5' 8 (1.727 m)   Wt 177 lb 12.8 oz (80.6 kg)   SpO2 97%   BMI 27.03 kg/m     Wt Readings from Last 3 Encounters:  12/10/23 177 lb 12.8 oz (80.6 kg)  11/05/23 184 lb (83.5 kg)  09/25/23 184 lb 6.4 oz (83.6 kg)     GENERAL:  Well nourished, well developed in no acute distress NECK: No JVD; No carotid bruits CARDIAC: RRR, S1 and S2 present, no murmurs, no rubs, no gallops CHEST:  Clear to auscultation without rales, wheezing or rhonchi  Extremities: No pitting pedal edema. Pulses bilaterally symmetric with radial  2+ and dorsalis pedis 2+ NEUROLOGIC:  Alert and oriented x 3  Medication Adjustments/Labs and Tests Ordered: Current medicines are reviewed at length with the patient today.  Concerns regarding medicines are outlined above.  Orders Placed This Encounter  Procedures   LONG TERM MONITOR (3-14 DAYS)   Meds ordered this encounter  Medications   losartan  (COZAAR ) 25 MG tablet    Sig: Take 1 tablet (25 mg total) by mouth daily.    Dispense:  90 tablet    Refill:  3   rosuvastatin  (CRESTOR ) 5 MG tablet    Sig: Take 1 tablet (5 mg total) by mouth every Monday, Wednesday, and Friday.    Dispense:  45 tablet    Refill:  3    Signed, Elmo Shumard reddy Christin Moline, MD, MPH, [facility]. 12/10/2023 2:08 PM    [facility]

## 2023-12-10 NOTE — Assessment & Plan Note (Signed)
 Intermittent episodes of throbbing sensation in the chart. This could be related to uncontrolled blood pressures but also could be a sign of underlying ectopic beats.  He reportedly had history of PVCs in the past. Will proceed with Zio patch for 2 weeks.

## 2023-12-31 ENCOUNTER — Ambulatory Visit: Payer: Self-pay

## 2023-12-31 DIAGNOSIS — R002 Palpitations: Secondary | ICD-10-CM
